# Patient Record
Sex: Female | Born: 1960 | Race: White | Hispanic: No | Smoking: Never smoker
Health system: Southern US, Community
[De-identification: ages and names within clinical notes are randomized; demographics above are authoritative.]

## PROBLEM LIST (undated history)

## (undated) DIAGNOSIS — F32A Depression, unspecified: Secondary | ICD-10-CM

## (undated) DIAGNOSIS — K589 Irritable bowel syndrome without diarrhea: Secondary | ICD-10-CM

## (undated) DIAGNOSIS — F329 Major depressive disorder, single episode, unspecified: Secondary | ICD-10-CM

## (undated) DIAGNOSIS — F419 Anxiety disorder, unspecified: Secondary | ICD-10-CM

## (undated) HISTORY — DX: Depression, unspecified: F32.A

## (undated) HISTORY — DX: Anxiety disorder, unspecified: F41.9

## (undated) HISTORY — DX: Major depressive disorder, single episode, unspecified: F32.9

## (undated) HISTORY — DX: Irritable bowel syndrome, unspecified: K58.9

---

## 2010-01-31 ENCOUNTER — Ambulatory Visit: Payer: Self-pay | Admitting: Unknown Physician Specialty

## 2010-02-11 ENCOUNTER — Ambulatory Visit: Payer: Self-pay | Admitting: Unknown Physician Specialty

## 2010-07-14 HISTORY — PX: ABDOMINAL HYSTERECTOMY: SHX81

## 2011-07-11 ENCOUNTER — Ambulatory Visit: Payer: Self-pay | Admitting: Obstetrics and Gynecology

## 2011-07-15 HISTORY — PX: LAPAROSCOPIC SUPRACERVICAL HYSTERECTOMY: SUR797

## 2011-07-17 ENCOUNTER — Ambulatory Visit: Payer: Self-pay | Admitting: Obstetrics and Gynecology

## 2011-07-18 LAB — HEMOGLOBIN: HGB: 11.7 g/dL — ABNORMAL LOW (ref 12.0–16.0)

## 2011-07-18 LAB — HEMATOCRIT: HCT: 34.2 % — ABNORMAL LOW (ref 35.0–47.0)

## 2011-07-21 LAB — PATHOLOGY REPORT

## 2013-12-12 ENCOUNTER — Ambulatory Visit: Payer: Self-pay | Admitting: Internal Medicine

## 2014-11-05 NOTE — Op Note (Signed)
PATIENT NAME:  Hailey Guerrero, WADDELL MR#:  [id number] DATE OF BIRTH:  1960/11/28  DATE OF PROCEDURE:  07/17/2011  PREOPERATIVE DIAGNOSIS: Leiomyomata of the uterus with menorrhagia.   POSTOPERATIVE DIAGNOSIS: Leiomyomata of the uterus with menorrhagia.     PROCEDURE:  Laparoscopic supracervical hysterectomy and cystoscopy.   PRIMARY SURGEON: Stoney Bang. Georgianne Fick, M.D.   ASSISTANTGlean Salen, MD.   ANESTHESIA:  General.   INTRAOPERATIVE FLUIDS: 1,200 mL of crystalloid.  ESTIMATED BLOOD LOSS: 100 mL.  URINE OUTPUT: 225 mL of clear urine. IMPLANT: Surgicel adhesion barrier placed over the cervical stump.  SPECIMENS REMOVED: Uterus without cervix. Specimen was morcellated.  DRAINS OR TUBES: Foley to gravity drainage.   PREOPERATIVE ANTIBIOTICS: 600 mg of clindamycin and 80 mg of gentamicin.   INTRAOPERATIVE FINDINGS: Enlarged fibroid uterus with one dominant fundal fibroid as well as a second left lateral broad ligament fibroid. Ovaries and tubes appeared grossly normal. Cystoscopy at the conclusion of the case revealed bilateral ureteral efflux.   COMPLICATIONS: None.  PATIENT CONDITION FOLLOWING THE PROCEDURE: Stable.   PROCEDURE IN DETAIL: The risks, benefits, and alternatives of the procedure were discussed with the patient prior to proceeding to the OR. The patient was taken to the OR, placed under general endotracheal anesthesia and positioned in the dorsal lithotomy position with arms tucked. She was prepped and draped in the usual sterile fashion. Following the time-out, a Foley catheter was placed to drain the patient's bladder and a Hulka tenaculum was placed on the anterior lip of the cervix using a sterile speculum for visualization. Next, attention was turned to the patient's abdomen. A small stab incision was made at the base of the umbilicus using an 11 blade scalpel. A Veress needle was then inserted into the abdomen noting two distinct clicks. Entry into the peritoneum was  confirmed with a water drop test and opening pressure of 4 mmHg. Following adequate insufflation and establishment of pneumoperitoneum, the Veress needle was removed. A Visiport 5 mm trocar was then advanced into the peritoneal cavity. Two lateral ports were then placed under direct visualization. These included one 5 mm port on the patient's right and an 11 mm port on the patient's left. Inspection of the peritoneal cavity revealed the above noted findings.  Attention was then turned to the right cornual region. The utero-ovarian ligament and falopian tube were transected using the harmonic scalpel. The round ligament was then incised with the harmonic scalpel and the anterior leaf of the broad ligament was incised down to the level of the internal cervical os. A bladder flap was then created. The uterine vessels were identified and cauterized using a bipolar cautery, but were not transected at this time. Attention was then turned to the patient's left cornual region. It was dissected in a similar manner. It became evident the patient had a moderate sized lower uterine segment/round broad ligament fibroid on the left following transection of the round ligament. This fibroid was shelled out by incising the overlying broad ligament. The uterine artery was then visualized entering close to the base of the broad ligament the lower uterine segment fibroid. It was cauterized with bipolar cautery and then transected using the harmonic scalpel. The bladder flap was noted to be sufficiently developed off of the cervix and the specimen was amputated at this point using the harmonic scalpel. Following amputation of the uterine specimen, hemostasis was achieved using the bipolar cautery. There was an area of bleeding on the right portion of the cervical stump which  appeared to be a branch of the uterine artery which took several attempts to achieve adequate hemostasis. Following achievement of adequate hemostasis and  irrigation of the pelvis, the uterine specimen was morcellated using a Gynecare morcellator. After morcellation, the pelvis was once again irrigated and inspected and noted to be hemostatic. Given the broad ligament fibroid, as well as the need for some bipolar on the left side to achieve hemostasis, it was decided to proceed with cystoscopy to verify no apparent ureteral injuries. The patient was administered indigo carmine prior to beginning cystoscopy. Upon performing cystoscopy, the bladder was noted to be normal in appearance with normal bladder mucosa. Both ureteral orifices were visualized and showed good brisk efflux of urine. Following cystoscopy, attention was once again turned to the abdomen. Insufflation which had been discontinued during cystoscopy was restarted and the pelvis was reinspected with good hemostasis noted. An endoscopic closure device was used to close the fascial incision of the 11 mm port site on the left. Following this, the remaining ports were removed under direct visualization. The 11 mm port site did show remaining fascial defect which was closed using a 2-0 Vicryl on a GU needle. The skin on all trocar incisions was then closed using Dermabond. Sponge, needle, and instrument counts were correct x2. The patient tolerated the procedure well and was taken to the recovery room in stable condition.    ____________________________ Stoney Bang. Georgianne Fick, MD ams:ap D: 07/17/2011 22:15:25 ET T: 07/18/2011 10:06:25 ET JOB#: [id number]  cc: Stoney Bang. Georgianne Fick, MD, <Dictator> Conan Bowens Madelon Lips MD ELECTRONICALLY SIGNED 07/22/2011 7:29

## 2014-11-05 NOTE — Op Note (Signed)
PATIENT NAME:  Hailey Guerrero, IZZO MR#:  [id number] DATE OF BIRTH:  01-Feb-1961  DATE OF PROCEDURE:  07/17/2011  PREOPERATIVE DIAGNOSIS: Leiomyomata of the uterus with menorrhagia.   POSTOPERATIVE DIAGNOSIS: Leiomyomata of the uterus with menorrhagia.   PROCEDURE:  Laparoscopic supracervical hysterectomy and cystoscopy.  PRIMARY SURGEON: Malachy Mood, MD.   ASSISTANTGlean Salen, MD.  ANESTHESIA:  General endotracheal.   TYPES OF FLUIDS:  1,200 milliliters of crystalloid.  ESTIMATED BLOOD LOSS: 100 mL.  URINE OUTPUT: 225 mL.  PREOPERATIVE ANTIBIOTICS: 600 mg of clindamycin and 80 mg of gentamicin.   <<MISSING TEXT>> Static dictation.    ____________________________ Stoney Bang. Georgianne Fick, MD ams:ap D: 07/17/2011 22:03:30 ET T: 07/18/2011 10:02:33 ET JOB#: [id number]  cc: Stoney Bang. Georgianne Fick, MD, <Dictator>

## 2015-03-28 ENCOUNTER — Encounter: Payer: Self-pay | Admitting: *Deleted

## 2015-04-09 ENCOUNTER — Ambulatory Visit: Payer: Self-pay | Admitting: General Surgery

## 2015-04-11 ENCOUNTER — Encounter: Payer: Self-pay | Admitting: General Surgery

## 2015-04-11 ENCOUNTER — Ambulatory Visit (INDEPENDENT_AMBULATORY_CARE_PROVIDER_SITE_OTHER): Payer: BC Managed Care – PPO | Admitting: General Surgery

## 2015-04-11 VITALS — BP 130/74 | HR 87 | Resp 12 | Ht 65.0 in | Wt 151.0 lb

## 2015-04-11 DIAGNOSIS — N644 Mastodynia: Secondary | ICD-10-CM | POA: Diagnosis not present

## 2015-04-11 NOTE — Progress Notes (Signed)
Patient ID: Hailey Guerrero, female   DOB: 06-Jun-1961, 54 y.o.   MRN: [id number]  Chief Complaint  Patient presents with  . Other    right breast pain    HPI Hailey Guerrero is a 54 y.o. female here today for a evaluation of right breast pain. Mammogram is scheduled for 04/17/15 at [facility]. She states she was having right breast pain for about 2 weeks, and it is now resolved.  The patient is not aware of any trauma to the area. She reports working in her yard extensively, but this is nothing new. She does not recall any triggers to the short lived, stabbing sensation she would appreciate in the upper-outer quadrant of the right breast. Patient does not do self breast checks. Last mammogram about 7 years ago.                                                                                                         HPI  No past medical history on file.  Past Surgical History  Procedure Laterality Date  . Abdominal hysterectomy  2012    No family history on file.  Social History Social History  Substance Use Topics  . Smoking status: Never Smoker   . Smokeless tobacco: None  . Alcohol Use: 0.0 oz/week    0 Standard drinks or equivalent per week    Allergies  Allergen Reactions  . Penicillins Other (See Comments)    Passes out     Current Outpatient Prescriptions  Medication Sig Dispense Refill  . cyclobenzaprine (FLEXERIL) 10 MG tablet Take 10 mg by mouth 3 (three) times daily.     Marland Kitchen HYDROcodone-acetaminophen (NORCO/VICODIN) 5-325 MG tablet 2 tablets every 6 (six) hours as needed.     . predniSONE (STERAPRED UNI-PAK 21 TAB) 10 MG (21) TBPK tablet 10 mg daily.      No current facility-administered medications for this visit.    Review of Systems Review of Systems  Constitutional: Negative.   Respiratory: Negative.   Cardiovascular: Negative.     Blood pressure 130/74, pulse 87, resp. rate 12, height 5\' 5"  (0.086 m), weight 151 lb (68.493 kg).  Physical Exam Physical Exam   Constitutional: She is oriented to person, place, and time. She appears well-developed and well-nourished.  Eyes: Conjunctivae are normal. No scleral icterus.  Neck: Neck supple.  Cardiovascular: Normal rate, regular rhythm and normal heart sounds.   Pulmonary/Chest: Effort normal and breath sounds normal. Right breast exhibits no inverted nipple, no mass, no nipple discharge, no skin change and no tenderness. Left breast exhibits no inverted nipple, no mass, no nipple discharge, no skin change and no tenderness.  Lymphadenopathy:    She has no cervical adenopathy.    She has no axillary adenopathy.  Neurological: She is alert and oriented to person, place, and time.  Skin: Skin is warm and dry.    Data Reviewed GYN notes from Bunnie Pion, M.D. dated 03/28/2015.  Assessment    Benign breast exam.    Plan    The patient is scheduled  for screening mammograms with Dr. Ammie Dalton on 04/17/2015. Unless an abnormality is identified, no further workup is required.   Patient to return as needed.  Ref. Dr. Percell Boston, [place] Gleason 04/11/2015, 3:40 PM

## 2015-04-11 NOTE — Patient Instructions (Signed)
Patient to return as needed. Continue self breast exams. Call office for any new breast issues or concerns.'  

## 2016-04-01 ENCOUNTER — Ambulatory Visit: Payer: Self-pay | Admitting: Urology

## 2016-04-07 ENCOUNTER — Encounter: Payer: Self-pay | Admitting: Urology

## 2016-04-07 ENCOUNTER — Ambulatory Visit (INDEPENDENT_AMBULATORY_CARE_PROVIDER_SITE_OTHER): Payer: BC Managed Care – PPO | Admitting: Urology

## 2016-04-07 VITALS — BP 149/86 | HR 71 | Ht 65.0 in | Wt 136.1 lb

## 2016-04-07 DIAGNOSIS — R3129 Other microscopic hematuria: Secondary | ICD-10-CM | POA: Diagnosis not present

## 2016-04-07 DIAGNOSIS — R350 Frequency of micturition: Secondary | ICD-10-CM

## 2016-04-07 LAB — URINALYSIS, COMPLETE
Bilirubin, UA: NEGATIVE
GLUCOSE, UA: NEGATIVE
Ketones, UA: NEGATIVE
Nitrite, UA: NEGATIVE
PH UA: 5.5 (ref 5.0–7.5)
PROTEIN UA: NEGATIVE
Specific Gravity, UA: 1.025 (ref 1.005–1.030)
Urobilinogen, Ur: 0.2 mg/dL (ref 0.2–1.0)

## 2016-04-07 LAB — MICROSCOPIC EXAMINATION

## 2016-04-07 NOTE — Progress Notes (Signed)
04/07/2016 3:12 PM   Hailey Guerrero 1960-08-13 [id number]  Referring provider: Chad Cordial, PA-C [address] [place], [place] [address]  Chief Complaint  Patient presents with  . New Patient (Initial Visit)    Recurrent uti    HPI: Patient is a 55 -year-old Caucasian female who presents today as a referral from their PCP, Alicia B Copland, PA-C, for microscopic hematuria.  Patient was found to have microscopic hematuria on two occassions with 4-10 RBC's/hpf.   Patient doesn't have a prior history of microscopic hematuria.    She does have a prior history of recurrent urinary tract infections (per patient report, not documented), but she denies nephrolithiasis, trauma to the genitourinary tract or malignancies of the genitourinary tract.   She does not have a family medical history of nephrolithiasis, malignancies of the genitourinary tract or hematuria.   Today, she is having symptoms of frequent urination, urgency, dysuria, nocturia, but she is not having incontinence, hesitancy, intermittency, straining to urinate or a weak urinary stream.  Her UA today demonstrates 3-10 RBC's/hpf.    She is not experiencing any suprapubic pain, abdominal pain or flank pain.  She denies any recent fevers, chills, nausea or vomiting.   She has not had any recent imaging studies.   She is not a smoker.  She is not exposed to secondhand smoke.  She does not work with chemicals.     PMH: Past Medical History:  Diagnosis Date  . Anxiety   . Depression     Surgical History: Past Surgical History:  Procedure Laterality Date  . ABDOMINAL HYSTERECTOMY  2012    Home Medications:    Medication List       Accurate as of 04/07/16  3:12 PM. Always use your most recent med list.          ibuprofen 200 MG tablet Commonly known as:  ADVIL,MOTRIN Take by mouth.   loratadine 10 MG tablet Commonly known as:  CLARITIN Take 10 mg by mouth daily.       Allergies:  Allergies    Allergen Reactions  . Penicillins Other (See Comments)    Passes out     Family History: Family History  Problem Relation Age of Onset  . Bladder Cancer Neg Hx   . Kidney cancer Neg Hx     Social History:  reports that she has never smoked. She has never used smokeless tobacco. She reports that she drinks alcohol. She reports that she does not use drugs.  ROS: UROLOGY Frequent Urination?: Yes Hard to postpone urination?: Yes Burning/pain with urination?: Yes Get up at night to urinate?: Yes Leakage of urine?: No Urine stream starts and stops?: No Trouble starting stream?: No Do you have to strain to urinate?: No Blood in urine?: Yes Urinary tract infection?: Yes Sexually transmitted disease?: No Injury to kidneys or bladder?: No Painful intercourse?: No Weak stream?: No Currently pregnant?: No Vaginal bleeding?: No Last menstrual period?: n  Gastrointestinal Nausea?: No Vomiting?: No Indigestion/heartburn?: Yes Diarrhea?: Yes Constipation?: No  Constitutional Fever: No Night sweats?: Yes Weight loss?: No Fatigue?: Yes  Skin Skin rash/lesions?: No Itching?: No  Eyes Blurred vision?: No Double vision?: No  Ears/Nose/Throat Sore throat?: Yes Sinus problems?: Yes  Hematologic/Lymphatic Swollen glands?: No Easy bruising?: No  Cardiovascular Leg swelling?: No Chest pain?: No  Respiratory Cough?: Yes Shortness of breath?: No  Endocrine Excessive thirst?: No  Musculoskeletal Back pain?: No Joint pain?: No  Neurological Headaches?: Yes Dizziness?: No  Psychologic Depression?: No Anxiety?: Yes  Physical Exam: BP (!) 149/86 (BP Location: Left Arm, Patient Position: Sitting, Cuff Size: Normal)   Pulse 71   Ht 5\' 5"  (1.651 m)   Wt 136 lb 1.6 oz (61.7 kg)   BMI 22.65 kg/m   Constitutional: Well nourished. Alert and oriented, No acute distress. HEENT: [place] AT, moist mucus membranes. Trachea midline, no masses. Cardiovascular: No  clubbing, cyanosis, or edema. Respiratory: Normal respiratory effort, no increased work of breathing. GI: Abdomen is soft, non tender, non distended, no abdominal masses. Liver and spleen not palpable.  No hernias appreciated.  Stool sample for occult testing is not indicated.   GU: No CVA tenderness.  No bladder fullness or masses.  Atrophic external genitalia, normal pubic hair distribution, no lesions.  Normal urethral meatus, no lesions, no prolapse, no discharge.   No urethral masses, tenderness and/or tenderness. No bladder fullness, tenderness or masses. Normal vagina mucosa, good estrogen effect, no discharge, no lesions, good pelvic support, no cystocele or rectocele noted.  No cervical motion tenderness.  Uterus is freely mobile and non-fixed.  No adnexal/parametria masses or tenderness noted.  Anus and perineum are without rashes or lesions.    Skin: No rashes, bruises or suspicious lesions. Lymph: No cervical or inguinal adenopathy. Neurologic: Grossly intact, no focal deficits, moving all 4 extremities. Psychiatric: Normal mood and affect.  Laboratory Data: Lab Results  Component Value Date   HGB 11.7 (L) 07/18/2011   HCT 34.2 (L) 07/18/2011    Urinalysis 3-10 RBC's and 11-30 WBC's/hpf.  See EPIC.     Assessment & Plan:    1. Microscopic hematuria  -  I explained to the patient that there are a number of causes that can be associated with blood in the urine, such as stones, UTI's, damage to the urinary tract and/or cancer.  - At this time, I felt that the patient warranted further urologic evaluation.   The AUA guidelines state that a CT urogram is the preferred imaging study to evaluate hematuria.  - I explained to the patient that a contrast material will be injected into a vein and that in rare instances, an allergic reaction can result and may even life threatening   The patient denies any allergies to contrast, iodine and/or seafood and is not taking metformin.  - Her  reproductive status is status post hysterectomy    - Following the imaging study,  I've recommended a cystoscopy. I described how this is performed, typically in an office setting with a flexible cystoscope. We described the risks, benefits, and possible side effects, the most common of which is a minor amount of blood in the urine and/or burning which usually resolves in 24 to 48 hours.    - The patient had the opportunity to ask questions which were answered. Based upon this discussion, the patient is willing to proceed. Therefore, I've ordered: a CT Urogram and cystoscopy.  - She will return following all of the above for discussion of the results.     - Urinalysis, Complete  - CULTURE, URINE COMPREHENSIVE  - BUN+Creat  2. Frequency/urgency  - CT Urogram and cystoscopy are pending   Return for CT Urogram report and cystoscopy.  These notes generated with voice recognition software. I apologize for typographical errors.  Zara Council, [facility] [address], [address] [place] [address] [place], [place] [address] [phone]

## 2016-04-08 LAB — BUN+CREAT
BUN / CREAT RATIO: 12 (ref 9–23)
BUN: 10 mg/dL (ref 6–24)
Creatinine, Ser: 0.81 mg/dL (ref 0.57–1.00)
GFR calc non Af Amer: 83 mL/min/{1.73_m2} (ref >59)
GFR, EST AFRICAN AMERICAN: 95 mL/min/{1.73_m2} (ref >59)

## 2016-04-10 ENCOUNTER — Telehealth: Payer: Self-pay

## 2016-04-10 DIAGNOSIS — N390 Urinary tract infection, site not specified: Secondary | ICD-10-CM

## 2016-04-10 LAB — CULTURE, URINE COMPREHENSIVE

## 2016-04-10 NOTE — Telephone Encounter (Signed)
LMOM

## 2016-04-10 NOTE — Telephone Encounter (Signed)
-----   Message from Nori Riis, PA-C sent at 04/10/2016  2:13 PM EDT ----- Would you call the patient and have her clarify what she means by "passing out" when she takes PCN?  She has a positive urine culture and we need to start an antibiotic.

## 2016-04-11 NOTE — Telephone Encounter (Signed)
Okay.  Let's start her on Augementin 875/125, one twice daily for seven days for her UTI.

## 2016-04-11 NOTE — Telephone Encounter (Signed)
Spoke with pt in reference to taking PCN. Pt stated that she was a teenager and had the flu when given PCN. Pt states she is not really sure if she is truly allergic to PCN.

## 2016-04-14 MED ORDER — AMOXICILLIN-POT CLAVULANATE 875-125 MG PO TABS: 1.0000 | ORAL_TABLET | Freq: Two times a day (BID) | ORAL | 0 refills | Status: AC

## 2016-04-14 NOTE — Telephone Encounter (Signed)
Spoke with pt in reference to abx. Made aware medication has been sent to pharmacy. Pt voiced understanding.

## 2016-04-16 ENCOUNTER — Ambulatory Visit
Admission: RE | Admit: 2016-04-16 | Discharge: 2016-04-16 | Disposition: A | Discharge status: Home / Self Care | Payer: BC Managed Care – PPO | Source: Ambulatory Visit | Attending: Urology | Admitting: Urology

## 2016-04-16 DIAGNOSIS — R3129 Other microscopic hematuria: Secondary | ICD-10-CM | POA: Insufficient documentation

## 2016-04-16 DIAGNOSIS — K5730 Diverticulosis of large intestine without perforation or abscess without bleeding: Secondary | ICD-10-CM | POA: Insufficient documentation

## 2016-04-16 MED ORDER — IOPAMIDOL (ISOVUE-300) INJECTION 61%: 125.0000 mL | Freq: Once | INTRAVENOUS | Status: DC | PRN

## 2016-04-18 ENCOUNTER — Emergency Department
Admission: EM | Admit: 2016-04-18 | Discharge: 2016-04-18 | Disposition: A | Discharge status: Home / Self Care | Payer: BC Managed Care – PPO | Attending: Emergency Medicine | Admitting: Emergency Medicine

## 2016-04-18 ENCOUNTER — Emergency Department: Payer: BC Managed Care – PPO

## 2016-04-18 ENCOUNTER — Other Ambulatory Visit: Payer: Self-pay | Admitting: Urology

## 2016-04-18 ENCOUNTER — Telehealth: Payer: Self-pay

## 2016-04-18 DIAGNOSIS — R1031 Right lower quadrant pain: Secondary | ICD-10-CM | POA: Insufficient documentation

## 2016-04-18 DIAGNOSIS — R1032 Left lower quadrant pain: Secondary | ICD-10-CM | POA: Diagnosis not present

## 2016-04-18 DIAGNOSIS — R1013 Epigastric pain: Secondary | ICD-10-CM | POA: Insufficient documentation

## 2016-04-18 DIAGNOSIS — Z791 Long term (current) use of non-steroidal anti-inflammatories (NSAID): Secondary | ICD-10-CM | POA: Insufficient documentation

## 2016-04-18 DIAGNOSIS — R109 Unspecified abdominal pain: Secondary | ICD-10-CM

## 2016-04-18 DIAGNOSIS — K629 Disease of anus and rectum, unspecified: Secondary | ICD-10-CM

## 2016-04-18 LAB — CBC
HCT: 43.7 % (ref 35.0–47.0)
HEMOGLOBIN: 15 g/dL (ref 12.0–16.0)
MCH: 32.2 pg (ref 26.0–34.0)
MCHC: 34.2 g/dL (ref 32.0–36.0)
MCV: 94.2 fL (ref 80.0–100.0)
Platelets: 266 10*3/uL (ref 150–440)
RBC: 4.64 MIL/uL (ref 3.80–5.20)
RDW: 12.2 % (ref 11.5–14.5)
WBC: 6.1 10*3/uL (ref 3.6–11.0)

## 2016-04-18 LAB — URINALYSIS COMPLETE WITH MICROSCOPIC (ARMC ONLY)
Bilirubin Urine: NEGATIVE
Glucose, UA: NEGATIVE mg/dL
Ketones, ur: NEGATIVE mg/dL
NITRITE: NEGATIVE
PH: 6 (ref 5.0–8.0)
PROTEIN: NEGATIVE mg/dL
SPECIFIC GRAVITY, URINE: 1.009 (ref 1.005–1.030)

## 2016-04-18 LAB — BASIC METABOLIC PANEL
ANION GAP: 7 (ref 5–15)
BUN: 10 mg/dL (ref 6–20)
CALCIUM: 9.6 mg/dL (ref 8.9–10.3)
CHLORIDE: 106 mmol/L (ref 101–111)
CO2: 27 mmol/L (ref 22–32)
Creatinine, Ser: 0.69 mg/dL (ref 0.44–1.00)
GFR calc non Af Amer: >60 mL/min (ref >60)
GLUCOSE: 97 mg/dL (ref 65–99)
Potassium: 3.3 mmol/L — ABNORMAL LOW (ref 3.5–5.1)
Sodium: 140 mmol/L (ref 135–145)

## 2016-04-18 LAB — TROPONIN I: Troponin I: <0.03 ng/mL (ref <0.03)

## 2016-04-18 LAB — LIPASE, BLOOD: Lipase: 40 U/L (ref 11–51)

## 2016-04-18 NOTE — ED Notes (Signed)
Called lab to inform them of hepatic function panel add-on.

## 2016-04-18 NOTE — Telephone Encounter (Signed)
Pt called stating since seeing Larene Beach last she has developed upper abd pain, lower back pain, and a "knot" in her stomach. Please advise.

## 2016-04-18 NOTE — ED Triage Notes (Signed)
Pt states that she has been having epigastric pain for the past 2 days that radiate outwards,pt also is having bilat flank pain and bilat lower quadrant pain. Pt is currently being treated for recurrent uti's

## 2016-04-18 NOTE — ED Provider Notes (Addendum)
[facility] Emergency Department Provider Note  ____________________________________________   I have reviewed the triage vital signs and the nursing notes.   HISTORY  Chief Complaint Chest Pain    HPI STEPANIE Guerrero is a 55 y.o. female  with a hx of "irritable stomach"  And recurrent uti. She presents today complaining of cramping discomfort throughout her entire abdomen. Patient has been worked up for this before. She also has a history of hematuria, saw urology had a CT scan done 2 days ago which was unremarkable for acute pathology. They did notice some questionable polyps and she is to follow-up for a colonoscopy. The patient states that after the CT scan she began have diffuse abdominal cramping which sometimes happens when she gets upset or nervous, she is also being treated for urinary tract infection. She denies any significant dysuria or urinary frequency or flank pain but she does have a diffuse abdominal cramping sensation which comes and goes. Currently she is not having it. She states she has no shortness of breath no vomiting. She did have some mild nausea. She has a history of heat flashes she states and she has had some of those during the course of the last 2 days but she states that those happen irrespective of whether she is having abdominal discomfort. She denies any fever or chills. She states that she has had no diarrhea. There is nothing that makes the pain better there is nothing that makes the pain worse and at this time the pain is actually gone. She described the symptoms to her primary care doctor and instructed to come to the emergency room with concern for possible atypical ACS. Patient specifically denies any chest pain or exertional symptoms. She has no pain of any variety at this time.    Past Medical History:  Diagnosis Date  . Anxiety   . Depression     Patient Active Problem List   Diagnosis Date Noted  . Mastalgia 04/11/2015     Past Surgical History:  Procedure Laterality Date  . ABDOMINAL HYSTERECTOMY  2012    Prior to Admission medications   Medication Sig Start Date End Date Taking? Authorizing Provider  amoxicillin-clavulanate (AUGMENTIN) 875-125 MG tablet Take 1 tablet by mouth 2 (two) times daily. 04/14/16 04/21/16  Larene Beach A McGowan, PA-C  ibuprofen (ADVIL,MOTRIN) 200 MG tablet Take by mouth.    Historical Provider, MD  loratadine (CLARITIN) 10 MG tablet Take 10 mg by mouth daily.    Historical Provider, MD    Allergies Penicillins  Family History  Problem Relation Age of Onset  . Bladder Cancer Neg Hx   . Kidney cancer Neg Hx     Social History Social History  Substance Use Topics  . Smoking status: Never Smoker  . Smokeless tobacco: Never Used  . Alcohol use 0.0 oz/week    Review of Systems Constitutional: No fever/chills Eyes: No visual changes. ENT: No sore throat. No stiff neck no neck pain Cardiovascular: Denies chest pain. Respiratory: Denies shortness of breath. Gastrointestinal:   no vomiting.  No diarrhea.  No constipation. Genitourinary: Negative for dysuria. Musculoskeletal: Negative lower extremity swelling Skin: Negative for rash. Neurological: Negative for severe headaches, focal weakness or numbness. 10-point ROS otherwise negative.  ____________________________________________   PHYSICAL EXAM:  VITAL SIGNS: ED Triage Vitals  Enc Vitals Group     BP 04/18/16 1353 (!) 148/99     Pulse Rate 04/18/16 1353 70     Resp 04/18/16 1353 18  Temp 04/18/16 1353 98.5 F (36.9 C)     Temp Source 04/18/16 1353 Oral     SpO2 04/18/16 1353 100 %     Weight 04/18/16 1353 135 lb (61.2 kg)     Height 04/18/16 1353 5\' 5"  (1.651 m)     Head Circumference --      Peak Flow --      Pain Score 04/18/16 1354 1     Pain Loc --      Pain Edu? --      Excl. in [place]? --     Constitutional: Alert and oriented. Well appearing and in no acute distress. Eyes: Conjunctivae are  normal. PERRL. EOMI. Head: Atraumatic. Nose: No congestion/rhinnorhea. Mouth/Throat: Mucous membranes are moist.  Oropharynx non-erythematous. Neck: No stridor.   Nontender with no meningismus Cardiovascular: Normal rate, regular rhythm. Grossly normal heart sounds.  Good peripheral circulation. Respiratory: Normal respiratory effort.  No retractions. Lungs CTAB. Abdominal: Soft and nontender. No distention. No guarding no rebound Back:  There is no focal tenderness or step off.  there is no midline tenderness there are no lesions noted. there is no CVA tenderness Musculoskeletal: No lower extremity tenderness, no upper extremity tenderness. No joint effusions, no DVT signs strong distal pulses no edema Neurologic:  Normal speech and language. No gross focal neurologic deficits are appreciated.  Skin:  Skin is warm, dry and intact. No rash noted. Psychiatric: Mood and affect are normal. Speech and behavior are normal.  ____________________________________________   LABS (all labs ordered are listed, but only abnormal results are displayed)  Labs Reviewed  BASIC METABOLIC PANEL - Abnormal; Notable for the following:       Result Value   Potassium 3.3 (*)    All other components within normal limits  URINALYSIS COMPLETEWITH MICROSCOPIC ([facility] ONLY) - Abnormal; Notable for the following:    Color, Urine YELLOW (*)    APPearance HAZY (*)    Hgb urine dipstick 2+ (*)    Leukocytes, UA 1+ (*)    Bacteria, UA RARE (*)    Squamous Epithelial / LPF 0-5 (*)    All other components within normal limits  URINE CULTURE  CBC  TROPONIN I  LIPASE, BLOOD  HEPATIC FUNCTION PANEL   ____________________________________________  EKG  I personally interpreted any EKGs ordered by me or triage Normal sinus rhythm rate 69 bpm no acute ST elevation no acute ST depression, normal axis unremarkable EKG ____________________________________________  RADIOLOGY  I reviewed any imaging ordered by me  or triage that were performed during my shift and, if possible, patient and/or family made aware of any abnormal findings. ____________________________________________   PROCEDURES  Procedure(s) performed: None  Procedures  Critical Care performed: None  ____________________________________________   INITIAL IMPRESSION / ASSESSMENT AND PLAN / ED COURSE  Pertinent labs & imaging results that were available during my care of the patient were reviewed by me and considered in my medical decision making (see chart for details).  Patient with benign workup thus far, we will obtain liver function tests and and urine although she is already being treated for UTI we will add a culture in case that is not been done as an outpatient. She has only had 1 or 2 antibiotic pills and I would expect that she still has a UTI. However, there is nothing to suggest PID, pyelonephritis or any other acute intra-abdominal pathology such as cholecystitis appendicitis or diverticulitis. Patient has no peritoneal signs actually has no discomfort or abdominal pain or  tenderness at this time. I do not believe that the sister presents referred or atypical ACS but nonetheless we did do an EKG which was negative and despite 3 days of ongoing discomfort we did a troponin which was also negative. I do not think serial cardiac enzymes will be of utility given the negative troponin and the duration of antecedent symptoms. Patient is eager to go home and requesting discharge. Her liver function tests are reassuring, as my hope that we can get her safely home. Patient has very adequate outpatient follow-up and is already under good care for multiple different providers from any of these complaints. Extensive return precautions and follow-up given and understood. Serial abdominal exams are completely benign at this time.  ----------------------------------------- 5:33 PM on  04/18/2016 -----------------------------------------  Informed by the lab liver function test could not be obtained with a blood sample that they have. I have offered to the patient that we do check and patient is adamant that she will not stay for that and does not wish another blood draw. She understands the risks benefits alternatives of not checking this in the context of abdominal pain. Patient is angry that she was sent into her angry that she has been here this long and insisted on immediately being discharged. She has no symptoms or complaints physically, we will discharge her home with extensive return precautions. Patient understands that she should come back if she feels worse in anyway. At this time there is no evidence of ACS PE dissection or intra-abdominal pathology of any significance. She is being treated for UTI culture is pending.  Clinical Course   ____________________________________________   FINAL CLINICAL IMPRESSION(S) / ED DIAGNOSES  Final diagnoses:  None      This chart was dictated using voice recognition software.  Despite best efforts to proofread,  errors can occur which can change meaning.      Schuyler Amor, MD 04/18/16 [place], MD 04/18/16 [phone]

## 2016-04-18 NOTE — ED Notes (Signed)
Lab called and informed that there is not enough blood to run the hepatic function panel and more will need to be drawn.

## 2016-04-19 LAB — URINE CULTURE: CULTURE: NO GROWTH

## 2016-04-21 ENCOUNTER — Other Ambulatory Visit: Payer: Self-pay | Admitting: Physician Assistant

## 2016-04-21 ENCOUNTER — Ambulatory Visit
Admission: RE | Admit: 2016-04-21 | Discharge: 2016-04-21 | Disposition: A | Discharge status: Home / Self Care | Payer: BC Managed Care – PPO | Source: Ambulatory Visit | Attending: Physician Assistant | Admitting: Physician Assistant

## 2016-04-21 ENCOUNTER — Telehealth: Payer: Self-pay

## 2016-04-21 DIAGNOSIS — R1013 Epigastric pain: Secondary | ICD-10-CM | POA: Insufficient documentation

## 2016-04-21 DIAGNOSIS — R1011 Right upper quadrant pain: Secondary | ICD-10-CM

## 2016-04-21 DIAGNOSIS — R932 Abnormal findings on diagnostic imaging of liver and biliary tract: Secondary | ICD-10-CM | POA: Insufficient documentation

## 2016-04-21 NOTE — Telephone Encounter (Signed)
Pt called stating she has a constant throb in her right side and flank. Per Larene Beach pt CT is negative and pt should return to the ER due to possible liver issues. Made pt aware. Pt became very angry and stated "I went to the ER Friday and they made it out like everything was fine." Reinforced with pt she denied to have liver function labs drawn therefore she should go and have those labs drawn. Pt stated "of course I was not waiting around for that. I had already been there for hours." At which point pt hung up.

## 2016-04-25 ENCOUNTER — Other Ambulatory Visit: Payer: BC Managed Care – PPO

## 2016-04-29 ENCOUNTER — Other Ambulatory Visit: Payer: BC Managed Care – PPO

## 2016-05-12 ENCOUNTER — Ambulatory Visit (INDEPENDENT_AMBULATORY_CARE_PROVIDER_SITE_OTHER): Payer: BC Managed Care – PPO | Admitting: Urology

## 2016-05-12 VITALS — BP 160/98 | HR 71 | Ht 65.0 in | Wt 135.0 lb

## 2016-05-12 DIAGNOSIS — R3129 Other microscopic hematuria: Secondary | ICD-10-CM

## 2016-05-12 LAB — MICROSCOPIC EXAMINATION

## 2016-05-12 LAB — URINALYSIS, COMPLETE
Bilirubin, UA: NEGATIVE
Glucose, UA: NEGATIVE
KETONES UA: NEGATIVE
NITRITE UA: NEGATIVE
PH UA: 5.5 (ref 5.0–7.5)
Protein, UA: NEGATIVE
SPEC GRAV UA: 1.025 (ref 1.005–1.030)
Urobilinogen, Ur: 0.2 mg/dL (ref 0.2–1.0)

## 2016-05-12 MED ORDER — CIPROFLOXACIN HCL 500 MG PO TABS
500.0000 mg | ORAL_TABLET | Freq: Once | ORAL | Status: AC
  Administered 2016-05-12: 500 mg via ORAL

## 2016-05-12 MED ORDER — LIDOCAINE HCL 2 % EX GEL
1.0000 "application " | Freq: Once | CUTANEOUS | Status: AC
  Administered 2016-05-12: 1 via URETHRAL

## 2016-05-12 NOTE — Progress Notes (Signed)
   05/12/16  CC:  Chief Complaint  Patient presents with  . Cysto    HPI:  Blood pressure (!) 160/98, pulse 71, height 5\' 5"  (1.651 m), weight 135 lb (61.2 kg). NED. A&Ox3.   No respiratory distress   Abd soft, NT, ND Normal external genitalia with patent urethral meatus  Cystoscopy Procedure Note  Patient identification was confirmed, informed consent was obtained, and patient was prepped using Betadine solution.  Lidocaine jelly was administered per urethral meatus.    Preoperative abx where received prior to procedure.    Procedure: - Flexible cystoscope introduced, without any difficulty.   - Thorough search of the bladder revealed:    normal urethral meatus    normal urothelium    no stones    no ulcers     no tumors    no urethral polyps    no trabeculation  - Ureteral orifices were normal in position and appearance.  Post-Procedure: - Patient tolerated the procedure well  Assessment/ Plan:  Patient has had a complete hematuria evaluation which was largely unremarkable. 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 She does have a tight vaginal introitus and firm posterior vaginal wall. She may benefit from pelvic for physical therapy if the remaining aspect of her workup is nondiagnostic as she may have pelvic floor dysfunction. The patient will follow up with [place] on an as-needed basis.   Ardis Hughs, MD

## 2016-05-21 ENCOUNTER — Other Ambulatory Visit: Payer: Self-pay

## 2016-05-21 ENCOUNTER — Encounter: Payer: Self-pay | Admitting: Gastroenterology

## 2016-05-21 ENCOUNTER — Ambulatory Visit (INDEPENDENT_AMBULATORY_CARE_PROVIDER_SITE_OTHER): Payer: BC Managed Care – PPO | Admitting: Gastroenterology

## 2016-05-21 VITALS — BP 141/71 | HR 77 | Temp 98.4°F | Ht 65.0 in | Wt 137.0 lb

## 2016-05-21 DIAGNOSIS — R1013 Epigastric pain: Secondary | ICD-10-CM | POA: Insufficient documentation

## 2016-05-21 DIAGNOSIS — R198 Other specified symptoms and signs involving the digestive system and abdomen: Secondary | ICD-10-CM | POA: Insufficient documentation

## 2016-05-21 DIAGNOSIS — R140 Abdominal distension (gaseous): Secondary | ICD-10-CM | POA: Diagnosis not present

## 2016-05-21 DIAGNOSIS — R933 Abnormal findings on diagnostic imaging of other parts of digestive tract: Secondary | ICD-10-CM

## 2016-05-21 MED ORDER — RIFAXIMIN 550 MG PO TABS: 550.0000 mg | ORAL_TABLET | Freq: Three times a day (TID) | ORAL | 0 refills | Status: AC

## 2016-05-21 MED ORDER — PANTOPRAZOLE SODIUM 40 MG PO TBEC: 40.0000 mg | DELAYED_RELEASE_TABLET | Freq: Every day | ORAL | 0 refills | Status: DC

## 2016-05-21 NOTE — Progress Notes (Signed)
Gastroenterology Consultation  Referring Provider:     Tracie Harrier, MD Primary Care Physician:  Hailey Harrier, MD Primary Gastroenterologist:  Dr. Jonathon Guerrero  Reason for Consultation:     Abdominal pain         HPI:   Hailey Guerrero is a 55 y.o. y/o female referred for consultation & management  by Dr. Tracie Harrier, MD.     Abdominal pain: Onset:  Ongoing since 05/06/16- has had multiple UTIs. Since the past 2 weeks, not as severe . Comes and goes presently, previously was continuous. Occurs every 15 minutes, each episode lasts for a few seconds.  Site :epigastrium, middle of the abdomen , Radiation: was previously going to her back now not.  Nature of pain: feels like a rope tied around her  Aggravating factors:  Worse when she sits  Relieving factors :nothing she can be sure off  Weight loss: yes not much  NSAID use: none  PPI use :tried protonix for 14 days but did not work .took it at 2 pm daily .  Gall bladder surgery: intact  Frequency of bowel movements: usually first thing in the morning , going 1-2 times last few days, sloppy in nature  Change in bowel movements: changed last few months  Relief with bowel movements: unsure  Gas/Bloating/Abdominal distension: lots , lots of gurgling sounds, gas and bloating correlates with her abdominal pain.  When she passes gas not foul smelling . She chews gum - 1 piece everyday . No other artificial sugars.   Never had a colonoscopy. No family history of colon cancer or polyps.     [facility] RUQ - no gall stones CT hematuria work up : nodularity along wall of proximal rectum  Diverticulosis of descending colon   Stool studies 05/07/16-negative for infection . Hb 13.6. BMP,LFT-normal    Past Medical History:  Diagnosis Date  . Anxiety   . Depression     Past Surgical History:  Procedure Laterality Date  . ABDOMINAL HYSTERECTOMY  2012    Prior to Admission medications   Medication Sig Start Date End Date Taking?  Authorizing Provider  pantoprazole (PROTONIX) 40 MG tablet Take by mouth. 05/13/16  Yes Historical Provider, MD  dicyclomine (BENTYL) 10 MG capsule Take by mouth. 05/06/16 05/06/17  Historical Provider, MD    Family History  Problem Relation Age of Onset  . Bladder Cancer Neg Hx   . Kidney cancer Neg Hx      Social History  Substance Use Topics  . Smoking status: Never Smoker  . Smokeless tobacco: Never Used  . Alcohol use 0.0 oz/week    Allergies as of 05/21/2016 - Review Complete 05/21/2016  Allergen Reaction Noted  . Penicillins Other (See Comments) 04/11/2015    Review of Systems:    All systems reviewed and negative except where noted in HPI.   Physical Exam:  BP (!) 141/71   Pulse 77   Temp 98.4 F (36.9 C) (Oral)   Ht 5\' 5"  (1.651 m)   Wt 137 lb (62.1 kg)   BMI 22.80 kg/m  No LMP recorded. Patient has had a hysterectomy. Psych:  Alert and cooperative. Normal mood and affect. General:   Alert,  Well-developed, well-nourished, pleasant and cooperative in NAD Head:  Normocephalic and atraumatic. Eyes:  Sclera clear, no icterus.   Conjunctiva pink. Ears:  Normal auditory acuity. Nose:  No deformity, discharge, or lesions. Mouth:  No deformity or lesions,oropharynx pink & moist. Neck:  Supple; no masses or thyromegaly. Lungs:  Respirations even and unlabored.  Clear throughout to auscultation.   No wheezes, crackles, or rhonchi. No acute distress. Heart:  Regular rate and rhythm; no murmurs, clicks, rubs, or gallops. Abdomen:  Normal bowel sounds.  No bruits.  Soft, non-tender and non-distended without masses, hepatosplenomegaly or hernias noted.  No guarding or rebound tenderness.    Msk:  Symmetrical without gross deformities. Good, equal movement & strength bilaterally. Pulses:  Normal pulses noted. Extremities:  No clubbing or edema.  No cyanosis. Neurologic:  Alert and oriented x3;  grossly normal neurologically. Skin:  Intact without significant lesions or  rashes. No jaundice. Lymph Nodes:  No significant cervical adenopathy. Psych:  Alert and cooperative. Normal mood and affect.  Imaging Studies: No results found.  Assessment and Plan:   Hailey Guerrero is a 55 y.o. y/o female has been referred for abdominal pain . Appears the abdominal pain more acute in nature and seems to be slightly better. She does have an abnormality on her CT scan which shows nodularity in her rectum . She also has a lot of gas and bloating and may have small bowel bacterial overgrowth .   Plan: 1. EGD and colonoscopy to evaluate abdominal pain and abnormal CT scan  2. Stool for H pylori antigen  3. Advised to take protonix first thing in the morning on an empty stomach and eat 30 mins after' 4. Trial of Xifaxan to be commenced after bidirectional endoscopy .  5. Check for celiac disease  Follow up in 4 weeks  Dr Hailey Bellows MD

## 2016-05-22 ENCOUNTER — Other Ambulatory Visit: Payer: Self-pay

## 2016-05-22 DIAGNOSIS — Z1211 Encounter for screening for malignant neoplasm of colon: Secondary | ICD-10-CM

## 2016-05-22 MED ORDER — NA SULFATE-K SULFATE-MG SULF 17.5-3.13-1.6 GM/177ML PO SOLN: 1.0000 | Freq: Once | ORAL | 0 refills | Status: AC

## 2016-05-24 LAB — CELIAC DISEASE PANEL
ENDOMYSIAL IGA: NEGATIVE
IGA/IMMUNOGLOBULIN A, SERUM: 304 mg/dL (ref 87–352)
Transglutaminase IgA: <2 U/mL (ref 0–3)

## 2016-05-24 LAB — C-REACTIVE PROTEIN

## 2016-05-25 LAB — H. PYLORI ANTIGEN, STOOL: H PYLORI AG STL: NEGATIVE

## 2016-05-30 ENCOUNTER — Ambulatory Visit: Payer: BC Managed Care – PPO | Admitting: Anesthesiology

## 2016-05-30 ENCOUNTER — Encounter
Admission: RE | Disposition: A | Discharge status: Home / Self Care | Payer: Self-pay | Source: Ambulatory Visit | Attending: Gastroenterology

## 2016-05-30 ENCOUNTER — Ambulatory Visit
Admission: RE | Admit: 2016-05-30 | Discharge: 2016-05-30 | Disposition: A | Discharge status: Home / Self Care | Payer: BC Managed Care – PPO | Source: Ambulatory Visit | Attending: Gastroenterology | Admitting: Gastroenterology

## 2016-05-30 DIAGNOSIS — D490 Neoplasm of unspecified behavior of digestive system: Secondary | ICD-10-CM | POA: Diagnosis not present

## 2016-05-30 DIAGNOSIS — K2950 Unspecified chronic gastritis without bleeding: Secondary | ICD-10-CM | POA: Insufficient documentation

## 2016-05-30 DIAGNOSIS — Z880 Allergy status to penicillin: Secondary | ICD-10-CM | POA: Diagnosis not present

## 2016-05-30 DIAGNOSIS — K219 Gastro-esophageal reflux disease without esophagitis: Secondary | ICD-10-CM | POA: Diagnosis not present

## 2016-05-30 DIAGNOSIS — K5730 Diverticulosis of large intestine without perforation or abscess without bleeding: Secondary | ICD-10-CM | POA: Insufficient documentation

## 2016-05-30 DIAGNOSIS — R933 Abnormal findings on diagnostic imaging of other parts of digestive tract: Secondary | ICD-10-CM | POA: Diagnosis not present

## 2016-05-30 DIAGNOSIS — D375 Neoplasm of uncertain behavior of rectum: Secondary | ICD-10-CM | POA: Insufficient documentation

## 2016-05-30 DIAGNOSIS — R1013 Epigastric pain: Secondary | ICD-10-CM | POA: Diagnosis present

## 2016-05-30 HISTORY — PX: COLONOSCOPY WITH PROPOFOL: SHX5780

## 2016-05-30 HISTORY — PX: ESOPHAGOGASTRODUODENOSCOPY (EGD) WITH PROPOFOL: SHX5813

## 2016-05-30 SURGERY — COLONOSCOPY WITH PROPOFOL
Anesthesia: General

## 2016-05-30 MED ORDER — FENTANYL CITRATE (PF) 100 MCG/2ML IJ SOLN
INTRAMUSCULAR | Status: DC | PRN
  Administered 2016-05-30: 50 ug via INTRAVENOUS

## 2016-05-30 MED ORDER — PROPOFOL 500 MG/50ML IV EMUL
INTRAVENOUS | Status: DC | PRN
  Administered 2016-05-30: 125 ug/kg/min via INTRAVENOUS

## 2016-05-30 MED ORDER — PROPOFOL 10 MG/ML IV BOLUS
INTRAVENOUS | Status: DC | PRN
  Administered 2016-05-30: 60 mg via INTRAVENOUS

## 2016-05-30 MED ORDER — MIDAZOLAM HCL 2 MG/2ML IJ SOLN
INTRAMUSCULAR | Status: DC | PRN
  Administered 2016-05-30: 1 mg via INTRAVENOUS

## 2016-05-30 MED ORDER — SODIUM CHLORIDE 0.9 % IV SOLN
INTRAVENOUS | Status: DC
  Administered 2016-05-30 (×2): via INTRAVENOUS

## 2016-05-30 MED ORDER — PHENYLEPHRINE HCL 10 MG/ML IJ SOLN
INTRAMUSCULAR | Status: DC | PRN
  Administered 2016-05-30 (×5): 100 ug via INTRAVENOUS

## 2016-05-30 NOTE — Anesthesia Preprocedure Evaluation (Signed)
Anesthesia Evaluation  Patient identified by MRN, date of birth, ID band Patient awake    Reviewed: Allergy & Precautions, NPO status , Patient's Chart, lab work & pertinent test results  History of Anesthesia Complications Negative for: history of anesthetic complications  Airway Mallampati: II       Dental   Pulmonary neg pulmonary ROS,           Cardiovascular negative cardio ROS       Neuro/Psych negative neurological ROS     GI/Hepatic Neg liver ROS, GERD  Medicated,  Endo/Other  negative endocrine ROS  Renal/GU negative Renal ROS     Musculoskeletal   Abdominal   Peds  Hematology negative hematology ROS (+)   Anesthesia Other Findings   Reproductive/Obstetrics                             Anesthesia Physical Anesthesia Plan  ASA: II  Anesthesia Plan: General   Post-op Pain Management:    Induction: Intravenous  Airway Management Planned: Nasal Cannula  Additional Equipment:   Intra-op Plan:   Post-operative Plan:   Informed Consent: I have reviewed the patients History and Physical, chart, labs and discussed the procedure including the risks, benefits and alternatives for the proposed anesthesia with the patient or authorized representative who has indicated his/her understanding and acceptance.     Plan Discussed with:   Anesthesia Plan Comments:         Anesthesia Quick Evaluation

## 2016-05-30 NOTE — Op Note (Signed)
[facility] Gastroenterology Patient Name: Hailey Guerrero Procedure Date: 05/30/2016 11:13 AM MRN: [id number] Account #: [id number] Date of Birth: [birth year] Admit Type: Outpatient Age: 55 Room: [facility] ENDO ROOM 4 Gender: Female Note Status: Finalized Procedure:            Colonoscopy Indications:          Abnormal CT of the GI tract Providers:            Jonathon Bellows MD, MD Referring MD:         Tracie Harrier, MD (Referring MD) Medicines:            Monitored Anesthesia Care Complications:        No immediate complications. Procedure:            Pre-Anesthesia Assessment:                       - Prior to the procedure, a History and Physical was                        performed, and patient medications, allergies and                        sensitivities were reviewed. The patient's tolerance of                        previous anesthesia was reviewed.                       - The risks and benefits of the procedure and the                        sedation options and risks were discussed with the                        patient. All questions were answered and informed                        consent was obtained.                       - ASA Grade Assessment: II - A patient with mild                        systemic disease.                       After obtaining informed consent, the colonoscope was                        passed under direct vision. Throughout the procedure,                        the patient's blood pressure, pulse, and oxygen                        saturations were monitored continuously. The                        Colonoscope was introduced through the anus and  advanced to the the cecum, identified by the                        appendiceal orifice, IC valve and transillumination.                        The colonoscopy was performed with ease. The patient                        tolerated the procedure well. The quality of the  bowel                        preparation was excellent. Findings:      A few small-mouthed diverticula were found in the sigmoid colon.      An ulcerated non-obstructing medium-sized mass was found in the rectum.       The mass was partially circumferential (involving one-third of the lumen       circumference). The mass measured two cm in length. In addition, its       diameter measured three mm. [Bleeding]on contact. This was biopsied with       a cold forceps for histology. Impression:           - Diverticulosis in the sigmoid colon.                       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                       - The examination was suspicious for a                        malignant-appearing tumor. Removal was not done.                        Biopsied. Recommendation:       - Discharge patient to home (with escort).                       - Patient has a contact number available for                        emergencies. The signs and symptoms of potential                        delayed complications were discussed with the patient.                        Return to normal activities tomorrow. Written discharge                        instructions were provided to the patient.                       - Resume previous diet.                       - Await pathology results.                       - Return to my office in 1 week. Procedure Code(s):    --- Professional ---  45380, Colonoscopy, flexible; with biopsy, single or                        multiple Diagnosis Code(s):    --- Professional ---                       D49.0, Neoplasm of unspecified behavior of digestive                        system                       K57.30, Diverticulosis of large intestine without                        perforation or abscess without bleeding                       R93.3, Abnormal findings on diagnostic imaging of other                        parts of digestive  tract CPT copyright 2016 [employer]. All rights reserved. The codes documented in this report are preliminary and upon coder review may  be revised to meet current compliance requirements. Jonathon Bellows, MD Jonathon Bellows MD, MD 05/30/2016 11:46:34 AM This report has been signed electronically. Number of Addenda: 0 Note Initiated On: 05/30/2016 11:13 AM Scope Withdrawal Time: 0 hours 12 minutes 40 seconds  Total Procedure Duration: 0 hours 17 minutes 40 seconds       [facility]

## 2016-05-30 NOTE — Op Note (Signed)
[facility] Gastroenterology Patient Name: Hailey Guerrero Procedure Date: 05/30/2016 11:10 AM MRN: [id number] Account #: [id number] Date of Birth: May 27, 1961 Admit Type: Ambulatory Age: 55 Room: [facility] ENDO ROOM 4 Gender: Female Note Status: Finalized Procedure:            Upper GI endoscopy Indications:          Dyspepsia Providers:            Jonathon Bellows MD, MD Referring MD:         Tracie Harrier, MD (Referring MD) Medicines:            Monitored Anesthesia Care Complications:        No immediate complications. Procedure:            Pre-Anesthesia Assessment:                       - ASA Grade Assessment: II - A patient with mild                        systemic disease.                       - Prior to the procedure, a History and Physical was                        performed, and patient medications, allergies and                        sensitivities were reviewed. The patient's tolerance of                        previous anesthesia was reviewed.                       - The risks and benefits of the procedure and the                        sedation options and risks were discussed with the                        patient. All questions were answered and informed                        consent was obtained.                       After obtaining informed consent, the endoscope was                        passed under direct vision. Throughout the procedure,                        the patient's blood pressure, pulse, and oxygen                        saturations were monitored continuously. The Endoscope                        was introduced through the mouth, and advanced to the  third part of duodenum. The upper GI endoscopy was                        accomplished with ease. The patient tolerated the                        procedure well. Findings:      The esophagus was normal.      The examined duodenum was normal.      The entire  examined stomach was normal. Biopsies were taken with a cold       forceps for histology. Impression:           - Normal esophagus.                       - Normal examined duodenum.                       - Normal stomach. Biopsied. Recommendation:       - Patient has a contact number available for                        emergencies. The signs and symptoms of potential                        delayed complications were discussed with the patient.                        Return to normal activities tomorrow. Written discharge                        instructions were provided to the patient.                       - Resume previous diet.                       - Discharge patient to home (with escort). Procedure Code(s):    --- Professional ---                       [id number], Esophagogastroduodenoscopy, flexible, transoral;                        with biopsy, single or multiple Diagnosis Code(s):    --- Professional ---                       R10.13, Epigastric pain CPT copyright 2016 American Medical Association. All rights reserved. The codes documented in this report are preliminary and upon coder review may  be revised to meet current compliance requirements. Jonathon Bellows, MD Jonathon Bellows MD, MD 05/30/2016 11:23:35 AM This report has been signed electronically. Number of Addenda: 0 Note Initiated On: 05/30/2016 11:10 AM      [facility]

## 2016-05-30 NOTE — Anesthesia Procedure Notes (Signed)
Date/Time: 05/30/2016 11:20 AM Performed by: Nelda Marseille Pre-anesthesia Checklist: Patient identified, Emergency Drugs available, Suction available, Patient being monitored and Timeout performed Oxygen Delivery Method: Nasal cannula

## 2016-05-30 NOTE — H&P (Signed)
  Jonathon Bellows MD [address] [place], [place] [address] Phone: [phone] Fax : [phone]  Primary Care Physician:  Tracie Harrier, MD Primary Gastroenterologist:  Dr. Jonathon Bellows   Pre-Procedure History & Physical: HPI:  Hailey Guerrero is a 55 y.o. female is here for an endoscopy and colonoscopy.   Past Medical History:  Diagnosis Date  . Anxiety   . Depression     Past Surgical History:  Procedure Laterality Date  . ABDOMINAL HYSTERECTOMY  2012    Prior to Admission medications   Medication Sig Start Date End Date Taking? Authorizing Provider  dicyclomine (BENTYL) 10 MG capsule Take by mouth. 05/06/16 05/06/17  Historical Provider, MD  pantoprazole (PROTONIX) 40 MG tablet Take 1 tablet (40 mg total) by mouth daily. 05/21/16 06/20/16  Jonathon Bellows, MD  rifaximin (XIFAXAN) 550 MG TABS tablet Take 1 tablet (550 mg total) by mouth 3 (three) times daily. 05/21/16 06/04/16  Jonathon Bellows, MD    Allergies as of 05/21/2016 - Review Complete 05/21/2016  Allergen Reaction Noted  . Penicillins Other (See Comments) 04/11/2015    Family History  Problem Relation Age of Onset  . Bladder Cancer Neg Hx   . Kidney cancer Neg Hx     Social History   Social History  . Marital status: Married    Spouse name: N/A  . Number of children: N/A  . Years of education: N/A   Occupational History  . Not on file.   Social History Main Topics  . Smoking status: Never Smoker  . Smokeless tobacco: Never Used  . Alcohol use 0.0 oz/week  . Drug use: No  . Sexual activity: Not on file   Other Topics Concern  . Not on file   Social History Narrative  . No narrative on file    Review of Systems: See HPI, otherwise negative ROS  Physical Exam: BP 134/88   Pulse 78   Temp 99.2 F (37.3 C) (Tympanic)   Resp 19   Ht 5\' 5"  (1.651 m)   Wt 137 lb (62.1 kg)   SpO2 100%   BMI 22.80 kg/m  General:   Alert,  pleasant and cooperative in NAD Head:  Normocephalic and  atraumatic. Neck:  Supple; no masses or thyromegaly. Lungs:  Clear throughout to auscultation.    Heart:  Regular rate and rhythm. Abdomen:  Soft, nontender and nondistended. Normal bowel sounds, without guarding, and without rebound.   Neurologic:  Alert and  oriented x4;  grossly normal neurologically.  Impression/Plan: Hailey Guerrero is here for an endoscopy and colonoscopy to be performed for abdominal pain, bloating and thickening/nodularity of rectum seen on CT scan   Risks, benefits, limitations, and alternatives regarding  endoscopy and colonoscopy have been reviewed with the patient.  Questions have been answered.  All parties agreeable.   Jonathon Bellows, MD  05/30/2016, 10:45 AM

## 2016-05-30 NOTE — Transfer of Care (Signed)
Immediate Anesthesia Transfer of Care Note  Patient: Hailey Guerrero  Procedure(s) Performed: Procedure(s): COLONOSCOPY WITH PROPOFOL (N/A) ESOPHAGOGASTRODUODENOSCOPY (EGD) WITH PROPOFOL (N/A)  Patient Location: PACU  Anesthesia Type:General  Level of Consciousness: sedated  Airway & Oxygen Therapy: Patient Spontanous Breathing and Patient connected to nasal cannula oxygen  Post-op Assessment: Report given to RN and Post -op Vital signs reviewed and stable  Post vital signs: Reviewed and stable  Last Vitals:  Vitals:   05/30/16 1043  BP: 134/88  Pulse: 78  Resp: 19  Temp: 37.3 C    Last Pain:  Vitals:   05/30/16 1043  TempSrc: Tympanic         Complications: No apparent anesthesia complications

## 2016-05-30 NOTE — Anesthesia Postprocedure Evaluation (Signed)
Anesthesia Post Note  Patient: Hailey Guerrero  Procedure(s) Performed: Procedure(s) (LRB): COLONOSCOPY WITH PROPOFOL (N/A) ESOPHAGOGASTRODUODENOSCOPY (EGD) WITH PROPOFOL (N/A)  Patient location during evaluation: Endoscopy Anesthesia Type: General Level of consciousness: awake and alert Pain management: pain level controlled Vital Signs Assessment: post-procedure vital signs reviewed and stable Respiratory status: spontaneous breathing and respiratory function stable Cardiovascular status: stable Anesthetic complications: no    Last Vitals:  Vitals:   05/30/16 1149 05/30/16 1150  BP:  97/66  Pulse:  66  Resp:  17  Temp: 36.4 C     Last Pain:  Vitals:   05/30/16 1150  TempSrc:   PainSc: Asleep                 KEPHART,WILLIAM K

## 2016-06-02 ENCOUNTER — Encounter: Payer: Self-pay | Admitting: Gastroenterology

## 2016-06-02 ENCOUNTER — Telehealth: Payer: Self-pay | Admitting: Gastroenterology

## 2016-06-02 LAB — SURGICAL PATHOLOGY

## 2016-06-02 NOTE — Telephone Encounter (Signed)
Patient called and wanted to know if her results are in yet? Please call

## 2016-06-02 NOTE — Telephone Encounter (Signed)
Pt notified results are not back as this procedure was just done on Friday. Will contact pt as soon as results are available.

## 2016-06-03 ENCOUNTER — Telehealth: Payer: Self-pay

## 2016-06-03 NOTE — Telephone Encounter (Signed)
-----   Message from Jonathon Bellows, MD sent at 05/30/2016 11:47 AM EST ----- Regarding: follow up appt in 1 week or 10 days Hi   Did her colonoscopy , am very concerned she has a rectal cancer, bx taken, would like follow up to see me to discuss results as soon as the results are back likely in 7-10 days.  Hailey Guerrero

## 2016-06-03 NOTE — Telephone Encounter (Signed)
Left vm requesting pt to schedule a follow up appt tomorrow morning at 9:30am in [place].

## 2016-06-04 ENCOUNTER — Encounter: Payer: Self-pay | Admitting: Gastroenterology

## 2016-06-04 ENCOUNTER — Ambulatory Visit (INDEPENDENT_AMBULATORY_CARE_PROVIDER_SITE_OTHER): Payer: BC Managed Care – PPO | Admitting: Gastroenterology

## 2016-06-04 VITALS — BP 166/86 | HR 70 | Temp 97.9°F | Ht 65.0 in | Wt 135.0 lb

## 2016-06-04 DIAGNOSIS — K629 Disease of anus and rectum, unspecified: Secondary | ICD-10-CM

## 2016-06-04 DIAGNOSIS — K6289 Other specified diseases of anus and rectum: Secondary | ICD-10-CM

## 2016-06-04 NOTE — Progress Notes (Signed)
Primary Care Physician: Tracie Harrier, MD  Primary Gastroenterologist:  Dr. Jonathon Bellows   Chief Complaint  Patient presents with  . Follow-up    colonoscopy results     HPI: Hailey Guerrero is a 55 y.o. female here to follow up for her recent colonoscopy. . She/He was last seen on 05/30/2016 when she underwent a colonoscopy by myself. She was initially seen on 05/21/16 for abdominal pain . She has an abnormal appearing rectum seen incidentally on a CT scan for renal stones. H pylori stool antigen was negative. EGD was normal and biopsies showed mild chronic gastritis with no H pylori. We proceeded with a colonoscopy. An ulcerated non-obstructing medium-sized mass was found in the rectum. The mass was partially circumferential (involving one-third of the lumen circumference). 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 It wasnot felt on a rectal exam .    Current Outpatient Prescriptions  Medication Sig Dispense Refill  . dicyclomine (BENTYL) 10 MG capsule Take by mouth.    . pantoprazole (PROTONIX) 40 MG tablet Take 1 tablet (40 mg total) by mouth daily. 30 tablet 0  . rifaximin (XIFAXAN) 550 MG TABS tablet Take 1 tablet (550 mg total) by mouth 3 (three) times daily. 42 tablet 0   No current facility-administered medications for this visit.     Allergies as of 06/04/2016 - Review Complete 06/04/2016  Allergen Reaction Noted  . Penicillins Other (See Comments) 04/11/2015    ROS:  General: Negative for anorexia, weight loss, fever, chills, fatigue, weakness. ENT: Negative for hoarseness, difficulty swallowing , nasal congestion. CV: Negative for chest pain, angina, palpitations, dyspnea on exertion, peripheral edema.  Respiratory: Negative for dyspnea at rest, dyspnea on exertion, cough, sputum, wheezing.  GI: See history of present illness. GU:  Negative for dysuria, hematuria, urinary  incontinence, urinary frequency, nocturnal urination.  Endo: Negative for unusual weight change.    Physical Examination:   BP (!) 166/86   Pulse 70   Temp 97.9 F (36.6 C) (Oral)   Ht 5\' 5"  (1.651 m)   Wt 135 lb (61.2 kg)   BMI 22.47 kg/m   General: Well-nourished, well-developed in no acute distress.  Eyes: No icterus. Conjunctivae pink. Mouth: Oropharyngeal mucosa moist and pink , no lesions erythema or exudate. Lungs: Clear to auscultation bilaterally. Non-labored. Heart: Regular rate and rhythm, no murmurs rubs or gallops.  Abdomen: Bowel sounds are normal, nontender, nondistended, no hepatosplenomegaly or masses, no abdominal bruits or hernia , no rebound or guarding.   Extremities: No lower extremity edema. No clubbing or deformities. Neuro: Alert and oriented x 3.  Grossly intact. Skin: Warm and dry, no jaundice.   Psych: Alert and cooperative, normal mood and affect.  Labs:   Imaging Studies: No results found.  Assessment and Plan:   Hailey Guerrero is a 55 y.o. y/o female here to discuss the results of her recent colonoscopy where a mass was seen in the rectum that was biopsied but not resected.I explained the results of the biopsy. Explained that only samples were taken from the mass , it may still very likelly have underlying cancer. Overall it still needs to come out. I will refer to the surgeons, obtain MRI pelvis. Once she has the surgery , will get the specimen tested for Lynch syndrome and then advise on appropriate surveillance for her self and her children.   Dr Jonathon Bellows  MD  Follow-up in 4 weeks

## 2016-06-16 ENCOUNTER — Ambulatory Visit
Admission: RE | Admit: 2016-06-16 | Discharge: 2016-06-16 | Disposition: A | Discharge status: Home / Self Care | Payer: BC Managed Care – PPO | Source: Ambulatory Visit | Attending: Gastroenterology | Admitting: Gastroenterology

## 2016-06-16 ENCOUNTER — Telehealth: Payer: Self-pay

## 2016-06-16 DIAGNOSIS — K629 Disease of anus and rectum, unspecified: Secondary | ICD-10-CM | POA: Diagnosis not present

## 2016-06-16 DIAGNOSIS — K6289 Other specified diseases of anus and rectum: Secondary | ICD-10-CM

## 2016-06-16 MED ORDER — GADOBENATE DIMEGLUMINE 529 MG/ML IV SOLN: 15.0000 mL | Freq: Once | INTRAVENOUS | Status: DC | PRN

## 2016-06-16 MED ORDER — GADOBENATE DIMEGLUMINE 529 MG/ML IV SOLN
15.0000 mL | Freq: Once | INTRAVENOUS | Status: AC | PRN
  Administered 2016-06-16: 12 mL via INTRAVENOUS

## 2016-06-16 NOTE — Telephone Encounter (Signed)
Pt notified of MRI results

## 2016-06-16 NOTE — Progress Notes (Signed)
Inform pt- no abnormalities seen beyond rectum on MRI, await surgery consult

## 2016-06-16 NOTE — Telephone Encounter (Signed)
-----   Message from Jonathon Bellows, MD sent at 06/16/2016  2:24 PM EST ----- Inform pt- no abnormalities seen beyond rectum on MRI, await surgery consult

## 2016-06-20 ENCOUNTER — Telehealth: Payer: Self-pay

## 2016-06-20 NOTE — Telephone Encounter (Signed)
-----   Message from Wayna Chalet, [place] sent at 06/04/2016  1:42 PM EST ----- Regarding: MRI appointment Look for MRI results. Patient will see Dr. Dahlia Byes on 06/25/2016 to go over her MRI and surgery consult.

## 2016-06-20 NOTE — Telephone Encounter (Signed)
Called patient to let her know that she will need to see Dr. Dahlia Byes on 06/25/2016. She stated that she was not able to talk at the time that I called her. All I wanted to do was to remind her of her appointment with Dr. Dahlia Byes.

## 2016-06-25 ENCOUNTER — Encounter: Payer: Self-pay | Admitting: Surgery

## 2016-06-25 ENCOUNTER — Ambulatory Visit (INDEPENDENT_AMBULATORY_CARE_PROVIDER_SITE_OTHER): Payer: BC Managed Care – PPO | Admitting: Surgery

## 2016-06-25 ENCOUNTER — Telehealth: Payer: Self-pay | Admitting: Surgery

## 2016-06-25 VITALS — BP 151/87 | HR 84 | Temp 98.7°F | Ht 65.0 in | Wt 138.2 lb

## 2016-06-25 DIAGNOSIS — K629 Disease of anus and rectum, unspecified: Secondary | ICD-10-CM

## 2016-06-25 DIAGNOSIS — K6289 Other specified diseases of anus and rectum: Secondary | ICD-10-CM

## 2016-06-25 NOTE — Progress Notes (Signed)
Patient ID: Hailey Guerrero, female   DOB: 1960/08/29, 55 y.o.   MRN: [id number]  HPI CHARLES SHARER is a 55 y.o. female with several month history of vague abdominal pain, nausea and abdominal distention. She also reports some occasional hematochezia about 1 episode a month. Some minimal weight loss. She does have great cardiovascular performance able to do more than 4 Mets of activity without any shortness of breath or chest pain. She does drink a bottle 1 of wine a day. 4 of the workup as CT scan was initially performed showing some nonspecific rectal lesion that prompted further evaluation by GI. She underwent EGD and colonoscopy by Dr. 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 MRI was ordered and personally reviewed and discussed with the radiologist. No evidence of nodal metastasis and no evidence of involvement in the sphincteric complex. Actually no evidence of a discrete rectal mass was visualized. Only previous operation is an abdominal hysterectomy. Personal review all imaging studies including MRI, CT scan and endoscopic images.  HPI  Past Medical History:  Diagnosis Date  . Anxiety   . Depression     Past Surgical History:  Procedure Laterality Date  . ABDOMINAL HYSTERECTOMY  2012  . COLONOSCOPY WITH PROPOFOL N/A 05/30/2016   Procedure: COLONOSCOPY WITH PROPOFOL;  Surgeon: Jonathon Bellows, MD;  Location: [facility] ENDOSCOPY;  Service: Endoscopy;  Laterality: N/A;  . ESOPHAGOGASTRODUODENOSCOPY (EGD) WITH PROPOFOL N/A 05/30/2016   Procedure: ESOPHAGOGASTRODUODENOSCOPY (EGD) WITH PROPOFOL;  Surgeon: Jonathon Bellows, MD;  Location: [facility] ENDOSCOPY;  Service: Endoscopy;  Laterality: N/A;    Family History  Problem Relation Age of Onset  . Healthy Mother   . Healthy Father   . Heart disease Maternal Grandfather   . Breast cancer Cousin   . Bladder Cancer Neg Hx   . Kidney cancer  Neg Hx   . Colon cancer Neg Hx     Social History Social History  Substance Use Topics  . Smoking status: Never Smoker  . Smokeless tobacco: Never Used  . Alcohol use 0.0 oz/week     Comment: 1 bottle of wine daily    No Active Allergies  Current Outpatient Prescriptions  Medication Sig Dispense Refill  . Prenatal Vit-Fe Fumarate-FA (M-VIT) tablet Take 1 tablet by mouth daily.    . RaNITidine HCl (ACID REDUCER PO) Take 1 tablet by mouth as needed.     No current facility-administered medications for this visit.      Review of Systems A 10 point review of systems was asked and was negative except for the information on the HPI  Physical Exam Blood pressure (!) 151/87, pulse 84, temperature 98.7 F (37.1 C), temperature source Oral, height 5\' 5"  (1.651 m), weight 62.7 kg (138 lb 3.2 oz). CONSTITUTIONAL: NAD EYES: Pupils are equal, round, and reactive to light, Sclera are non-icteric. EARS, NOSE, MOUTH AND THROAT: The oropharynx is clear. The oral mucosa is pink and moist. Hearing is intact to voice. LYMPH NODES:  Lymph nodes in the neck are normal. RESPIRATORY:  Lungs are clear. There is normal respiratory effort, with equal breath sounds bilaterally, and without pathologic use of accessory muscles. CARDIOVASCULAR: Heart is regular without murmurs, gallops, or rubs. GI: The abdomen is soft, nontender, and nondistended. There are no palpable masses. There is no hepatosplenomegaly. There are normal bowel sounds in all quadrants. Rectal: 6 cms soft mass on posterior rectum I was able to visualize  it well with the rigid scope but unable to assess the complete endorectal circumference. MUSCULOSKELETAL: Normal muscle strength and tone. No cyanosis or edema.   SKIN: Turgor is good and there are no pathologic skin lesions or ulcers. NEUROLOGIC: Motor and sensation is grossly normal. Cranial nerves are grossly intact. PSYCH:  Oriented to person, place and time. Affect is normal.  Data  Reviewed  I have personally reviewed the patient's imaging, laboratory findings and medical records.    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 MRI reviewed, d/w radiologist in detail. No evidence of nodal involvement and actually unable to visualize mass, likely given the soft density of it.  Physical exam I can feel the mass and is about 6 cm from the anal verge and is posterior. Given its proximity to the sphincter muscle I do recommend a specialized referral to colorectal surgery at [facility] to give her the best chance of a sphincter preserving oncologic operation. Discussed with the patient in detail and I do think that a radical resection is in her best interest given the fact that this mass is partially circumferential and with high-grade dysplasia that more than likely will be upgraded in the final pathological specimen. Also discussed with her about further diagnosis test including a PET/CT and and I do agree with her that probably [facility] should go ahead and get this since she is going to have the final treatment over there. We also discussed about the role of endoscopic the costal resection and again as stated above I do think that this lesion will need an oncologic Resection. I do think that From surgery given its early stage is indicated as opposed to neoadjuvant chemotherapy/radiation therapy. Extensive counseling provided to the patient and all the questions were answered. Plan to refer to [facility] given that an ultra low anastomosis w radical resection will be required .  Caroleen Hamman, MD FACS General Surgeon 06/25/2016, 11:18 AM

## 2016-06-25 NOTE — Telephone Encounter (Signed)
A referral has been faxed to [facility] for Rectal Mass per Dr Dahlia Byes.   All clinic notes, labs, imaging and pathology has been faxed as well.   Fax # [phone] Phone# [phone] Festus Aloe will review the records and then call patient within 5-7 business days to make an appointment.

## 2016-06-25 NOTE — Patient Instructions (Signed)
We will send your referral to [facility] Colorectal. Just know that they will contact you to schedule your appointment. However, if you don't hear from them within a week, please call us so we could call them.

## 2017-11-29 IMAGING — CT CT ABD-PEL WO/W CM
1 of 4 series · 8 of 32 positions shown, 13 images · IV contrast (APPLIED)
Comparison: None.

CLINICAL DATA: Micro hematuria

EXAM:
CT ABDOMEN AND PELVIS WITHOUT AND WITH CONTRAST
TECHNIQUE: Multidetector CT imaging of the abdomen and pelvis was performed
following the standard protocol before and following the bolus
administration of intravenous contrast.
CONTRAST:  125 mL Isovue

[Series 12: axial delay · axial · delayed · 0.88mm/px · z∈[-461,-91]mm · 8 of 96 slices shown, 13 images]
[im 11/96  soft-tissue]
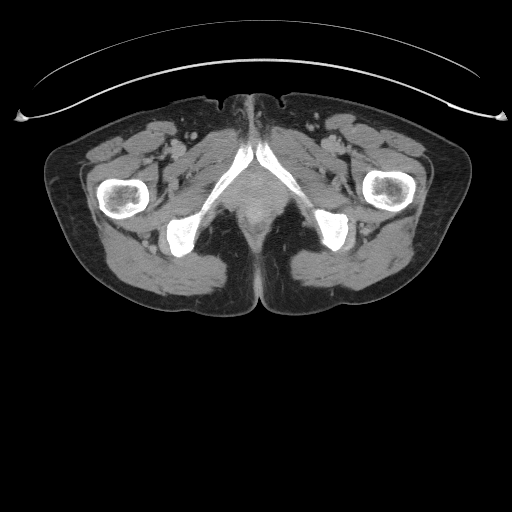
[im 11/96  bone]
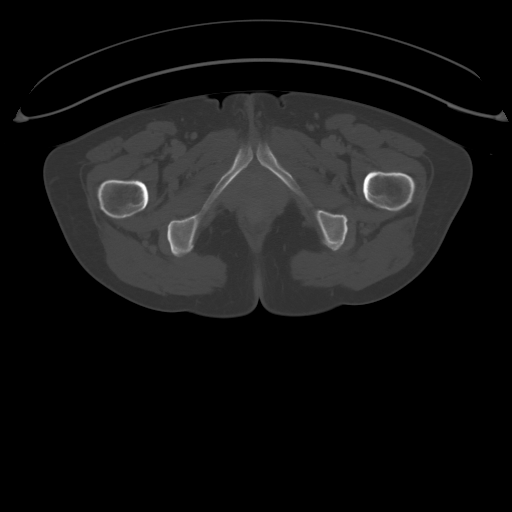
[im 22/96  soft-tissue]
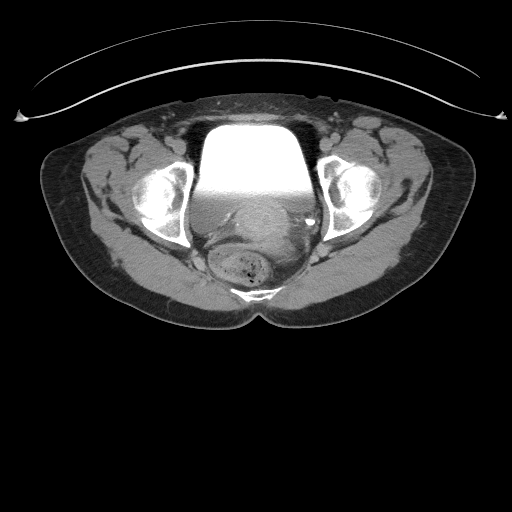
[im 32/96  soft-tissue]
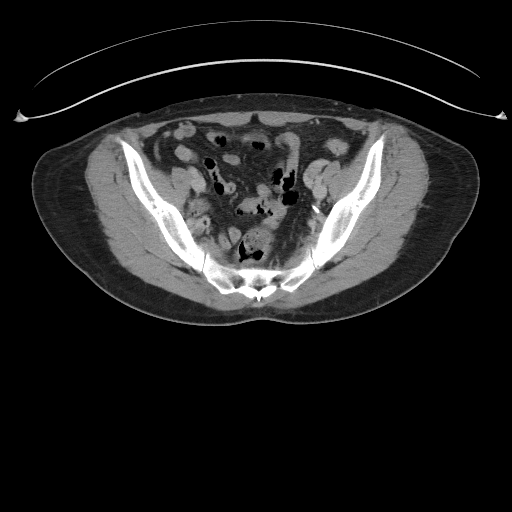
[im 43/96  soft-tissue]
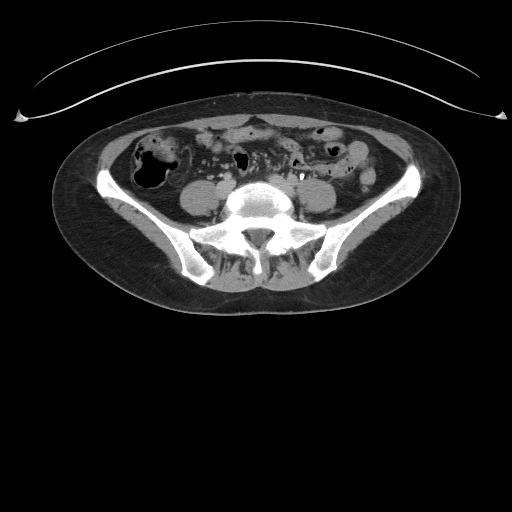
[im 53/96  soft-tissue]
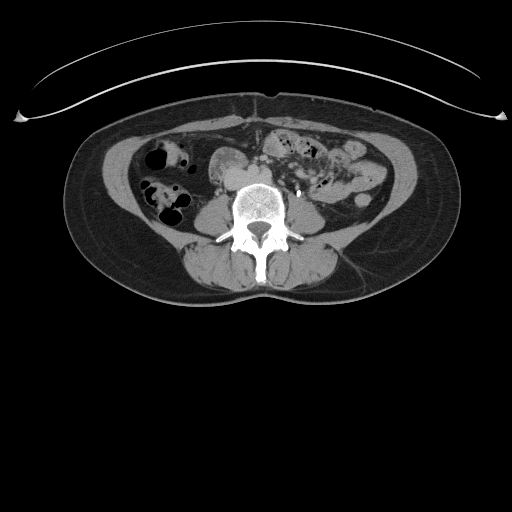
[im 53/96  lung]
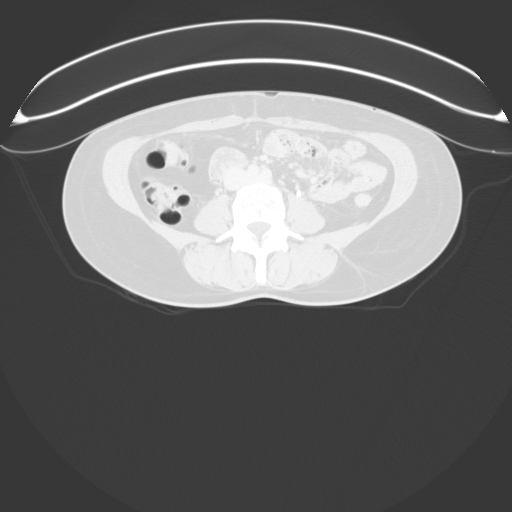
[im 64/96  soft-tissue]
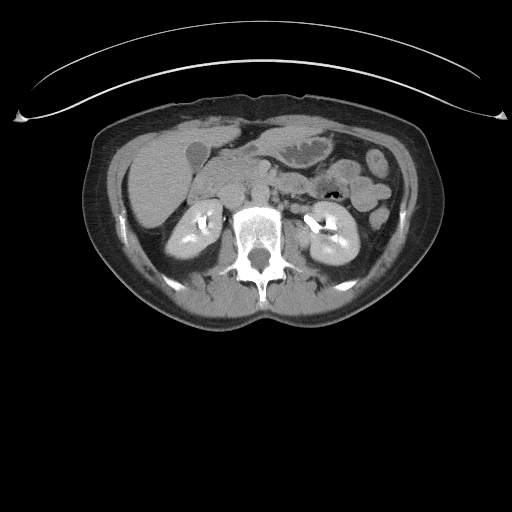
[im 64/96  lung]
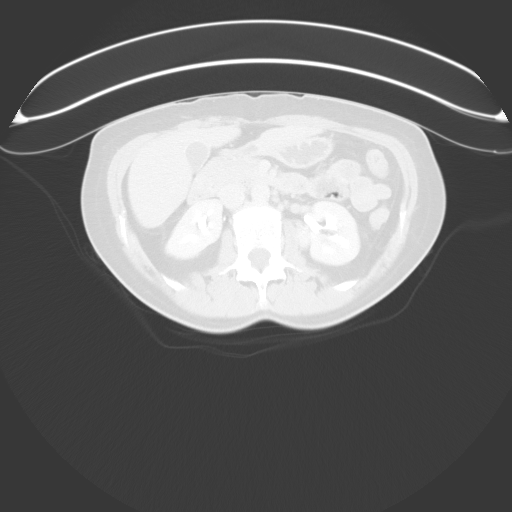
[im 74/96  soft-tissue]
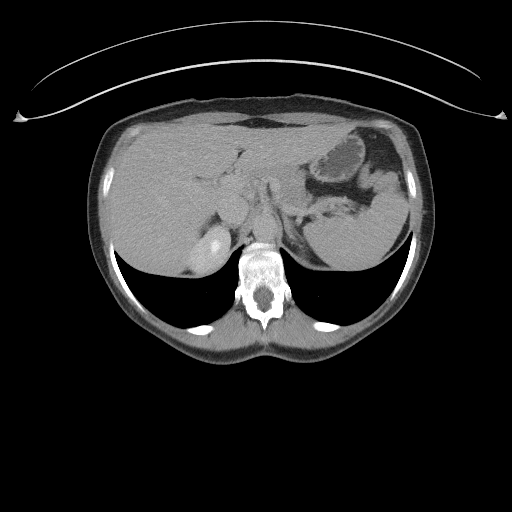
[im 74/96  lung]
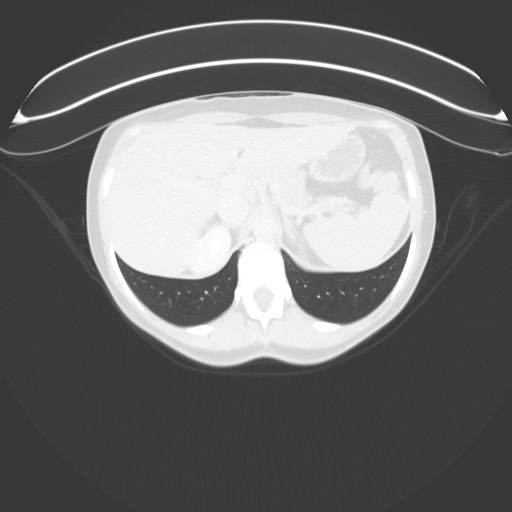
[im 85/96  soft-tissue]
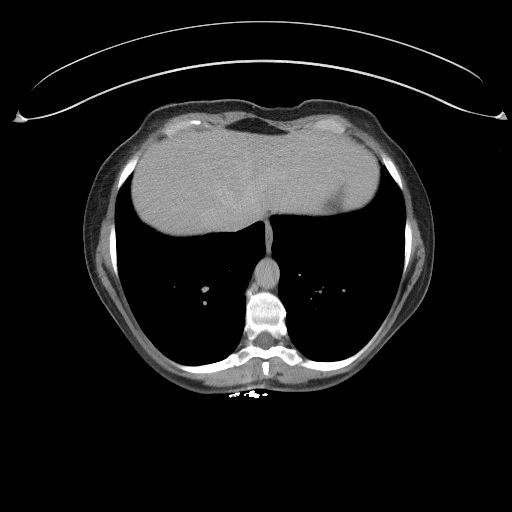
[im 85/96  lung]
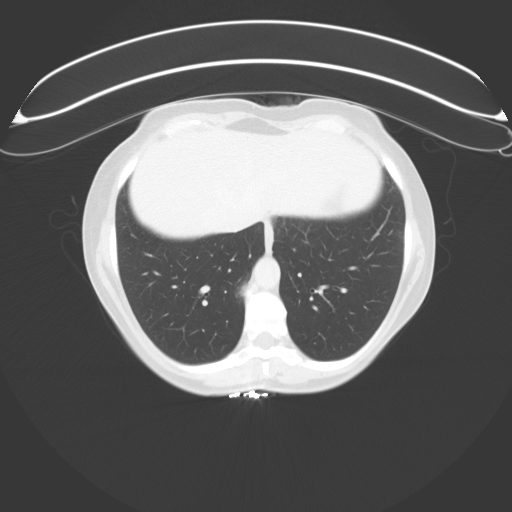

[8 of 32 positions shown; findings below may reference images not displayed]

FINDINGS: Lower chest: Lung bases are clear.

Hepatobiliary: No focal hepatic lesion. No biliary duct dilatation.
Gallbladder is normal. Common bile duct is normal.

Pancreas: Pancreas is normal. No ductal dilatation. No pancreatic
inflammation.

Spleen: Normal spleen

Adrenals/urinary tract: Adrenal glands are normal. No
nephrolithiasis or ureterolithiasis. No enhancing renal cortical
lesion. No filling defects within collecting systems or ureters.

Stomach/Bowel: Stomach, small bowel cecum normal. The colon
demonstrates several diverticula of the descending colon without
acute inflammation. Nodularity along the anterior wall of the
proximal rectum (image 74 series 4, image 61, series 10)

Vascular/Lymphatic: Small periaortic lymph nodes are not
pathologically enlarged. Aorta is normal. LEFT external iliac lymph
node measures 7 mm.

Reproductive: Post hysterectomy.  Normal adnexa.

Other: No free fluid.

Musculoskeletal: No aggressive osseous lesion.
IMPRESSION: 1. No explanation for hematuria. No nephrolithiasis,
ureterolithiasis, enhancing renal cortical lesion, or filling
defects within the collecting systems.
2. No bladder stones or filling defects in the bladder which does
not excluded a bladder lesion.
3. Nodularity along the anterior wall of the proximal rectum.
Recommend colonoscopy to evaluate for polypoid disease.
4. Mild Diverticulosis of the descending colon.
These results will be called to the ordering clinician or
representative by the Radiologist Assistant, and communication
documented in the PACS or zVision Dashboard.

## 2018-02-15 ENCOUNTER — Other Ambulatory Visit: Payer: Self-pay | Admitting: Internal Medicine

## 2018-02-15 DIAGNOSIS — R1011 Right upper quadrant pain: Secondary | ICD-10-CM

## 2018-02-17 ENCOUNTER — Other Ambulatory Visit: Payer: Self-pay | Admitting: Internal Medicine

## 2018-02-17 DIAGNOSIS — R1011 Right upper quadrant pain: Secondary | ICD-10-CM

## 2018-02-18 ENCOUNTER — Ambulatory Visit
Admission: RE | Admit: 2018-02-18 | Discharge: 2018-02-18 | Disposition: A | Discharge status: Home / Self Care | Payer: BC Managed Care – PPO | Source: Ambulatory Visit | Attending: Internal Medicine | Admitting: Internal Medicine

## 2018-02-18 DIAGNOSIS — R143 Flatulence: Secondary | ICD-10-CM | POA: Diagnosis not present

## 2018-02-18 DIAGNOSIS — R1011 Right upper quadrant pain: Secondary | ICD-10-CM

## 2018-02-18 DIAGNOSIS — R932 Abnormal findings on diagnostic imaging of liver and biliary tract: Secondary | ICD-10-CM | POA: Diagnosis not present

## 2018-06-22 ENCOUNTER — Ambulatory Visit (INDEPENDENT_AMBULATORY_CARE_PROVIDER_SITE_OTHER): Payer: BC Managed Care – PPO | Admitting: Maternal Newborn

## 2018-06-22 ENCOUNTER — Encounter: Payer: Self-pay | Admitting: Maternal Newborn

## 2018-06-22 VITALS — BP 152/90 | HR 109 | Ht 65.0 in | Wt 141.0 lb

## 2018-06-22 DIAGNOSIS — R300 Dysuria: Secondary | ICD-10-CM

## 2018-06-22 DIAGNOSIS — N390 Urinary tract infection, site not specified: Secondary | ICD-10-CM | POA: Diagnosis not present

## 2018-06-22 LAB — POCT URINALYSIS DIPSTICK
Bilirubin, UA: NEGATIVE
GLUCOSE UA: NEGATIVE
Ketones, UA: NEGATIVE
NITRITE UA: NEGATIVE
PROTEIN UA: NEGATIVE
SPEC GRAV UA: 1.01 (ref 1.010–1.025)
Urobilinogen, UA: 0.2 E.U./dL
pH, UA: 6 (ref 5.0–8.0)

## 2018-06-22 MED ORDER — CEPHALEXIN 500 MG PO CAPS: 500.0000 mg | ORAL_CAPSULE | Freq: Two times a day (BID) | ORAL | 0 refills | Status: AC

## 2018-06-22 NOTE — Progress Notes (Signed)
Obstetrics & Gynecology Office Visit   Chief Complaint:  Chief Complaint  Patient presents with  . Urinary Tract Infection    Burning with urination, frequent urination     History of Present Illness: Hailey Guerrero started feeling burning with urination this morning, and has had urinary frequency today. She took Azo, which helped with the dysuria. She does not have any hematuria, back pain, or abnormal discharge.  Review of Systems: Review of systems negative unless otherwise noted in HPI.  Past Medical History:  Past Medical History:  Diagnosis Date  . Anxiety   . Depression   . IBS (irritable bowel syndrome)     Past Surgical History:  Past Surgical History:  Procedure Laterality Date  . ABDOMINAL HYSTERECTOMY  2012  . COLONOSCOPY WITH PROPOFOL N/A 05/30/2016   Procedure: COLONOSCOPY WITH PROPOFOL;  Surgeon: Jonathon Bellows, MD;  Location: [facility] ENDOSCOPY;  Service: Endoscopy;  Laterality: N/A;  . ESOPHAGOGASTRODUODENOSCOPY (EGD) WITH PROPOFOL N/A 05/30/2016   Procedure: ESOPHAGOGASTRODUODENOSCOPY (EGD) WITH PROPOFOL;  Surgeon: Jonathon Bellows, MD;  Location: [facility] ENDOSCOPY;  Service: Endoscopy;  Laterality: N/A;    Gynecologic History: No LMP recorded. Patient has had a hysterectomy.  Obstetric History: [id number]  Family History:  Family History  Problem Relation Age of Onset  . Healthy Mother   . Healthy Father   . Heart disease Maternal Grandfather   . Breast cancer Cousin   . Bladder Cancer Neg Hx   . Kidney cancer Neg Hx   . Colon cancer Neg Hx     Social History:  Social History   Socioeconomic History  . Marital status: Divorced    Spouse name: Not on file  . Number of children: Not on file  . Years of education: Not on file  . Highest education level: Not on file  Occupational History  . Not on file  Social Needs  . Financial resource strain: Not on file  . Food insecurity:    Worry: Not on file    Inability: Not on file  . Transportation needs:    Medical:  Not on file    Non-medical: Not on file  Tobacco Use  . Smoking status: Never Smoker  . Smokeless tobacco: Never Used  Substance and Sexual Activity  . Alcohol use: Yes    Alcohol/week: 0.0 standard drinks    Comment: 1 bottle of wine daily  . Drug use: No  . Sexual activity: Yes    Birth control/protection: Surgical    Comment: Hysterectomy   Lifestyle  . Physical activity:    Days per week: Not on file    Minutes per session: Not on file  . Stress: Not on file  Relationships  . Social connections:    Talks on phone: Not on file    Gets together: Not on file    Attends religious service: Not on file    Active member of club or organization: Not on file    Attends meetings of clubs or organizations: Not on file    Relationship status: Not on file  . Intimate partner violence:    Fear of current or ex partner: Not on file    Emotionally abused: Not on file    Physically abused: Not on file    Forced sexual activity: Not on file  Other Topics Concern  . Not on file  Social History Narrative  . Not on file    Allergies:  No Known Allergies  Medications: Prior to Admission medications  Medication Sig Start Date End Date Taking? Authorizing Provider  ALPRAZolam (XANAX) 0.25 MG tablet TAKE 1 TABLET BY MOUTH NIGHTLY AS NEEDED FOR SLEEP 05/26/18  Yes [provider]  desipramine (NORPRAMIN) 25 MG tablet TAKE 2 TABLETS (50 MG TOTAL) BY MOUTH TWO (2) TIMES A DAY. 06/19/18  Yes [provider]  Prenatal Vit-Fe Fumarate-FA (M-VIT) tablet Take 1 tablet by mouth daily.   Yes [provider]  RaNITidine HCl (ACID REDUCER PO) Take 1 tablet by mouth as needed.   Yes [provider]    Physical Exam Vitals:  Vitals:   06/22/18 1526  BP: (!) 152/90  Pulse: (!) 109   No LMP recorded. Patient has had a hysterectomy.  General: NAD HEENT: normocephalic, anicteric Pulmonary: No increased work of breathing Neurologic: Grossly  intact Psychiatric: mood appropriate, affect full  Assessment: 57 y.o. [id number] with a possible urinary tract infection.  Plan: Problem List Items Addressed This Visit    None    Visit Diagnoses    Burning with urination    -  Primary   Relevant Orders   Urine Culture (Completed)   POCT Urinalysis Dipstick (Completed)   Urinary tract infection without hematuria, site unspecified       Relevant Medications   cephALEXin (KEFLEX) 500 MG capsule     UA shows a small amount of leukocytes. Will send urine culture. Explained options of beginning empiric treatment or waiting for results of urine culture. She desires to begin treatment now due to bothersome symptoms. Will notify if culture results necessitate change in therapy.  Avel Sensor, CNM 06/21/2018

## 2018-06-24 LAB — URINE CULTURE: ORGANISM ID, BACTERIA: NO GROWTH

## 2019-05-02 ENCOUNTER — Other Ambulatory Visit: Payer: Self-pay | Admitting: Internal Medicine

## 2019-05-02 DIAGNOSIS — Z0000 Encounter for general adult medical examination without abnormal findings: Secondary | ICD-10-CM

## 2019-05-02 DIAGNOSIS — Z1231 Encounter for screening mammogram for malignant neoplasm of breast: Secondary | ICD-10-CM

## 2019-08-23 ENCOUNTER — Ambulatory Visit
Admission: RE | Admit: 2019-08-23 | Discharge: 2019-08-23 | Disposition: A | Discharge status: Home / Self Care | Payer: BC Managed Care – PPO | Source: Ambulatory Visit | Attending: Internal Medicine | Admitting: Internal Medicine

## 2019-08-23 DIAGNOSIS — Z0000 Encounter for general adult medical examination without abnormal findings: Secondary | ICD-10-CM | POA: Insufficient documentation

## 2019-08-23 DIAGNOSIS — Z1231 Encounter for screening mammogram for malignant neoplasm of breast: Secondary | ICD-10-CM | POA: Insufficient documentation

## 2019-09-17 ENCOUNTER — Ambulatory Visit: Payer: BC Managed Care – PPO | Attending: Internal Medicine

## 2019-09-17 DIAGNOSIS — Z23 Encounter for immunization: Secondary | ICD-10-CM

## 2019-09-17 NOTE — Progress Notes (Signed)
   Covid-19 Vaccination Clinic  Name:  Hailey Guerrero    MRN: [id number] DOB: 03/24/1961  09/17/2019  [name] was observed post Covid-19 immunization for 15 minutes without incident. She was provided with Vaccine Information Sheet and instruction to access the V-Safe system.   [name] was instructed to call 911 with any severe reactions post vaccine: Marland Kitchen Difficulty breathing  . Swelling of face and throat  . A fast heartbeat  . A bad rash all over body  . Dizziness and weakness   Immunizations Administered    Name Date Dose VIS Date Route   Pfizer COVID-19 Vaccine 09/17/2019  8:30 AM 0.3 mL 06/24/2019 Intramuscular   Manufacturer: [place]   Lot: [id number]   [place]: [id number]

## 2019-10-11 ENCOUNTER — Ambulatory Visit: Payer: BC Managed Care – PPO | Attending: Internal Medicine

## 2019-10-11 DIAGNOSIS — Z23 Encounter for immunization: Secondary | ICD-10-CM

## 2019-10-11 NOTE — Progress Notes (Signed)
   Covid-19 Vaccination Clinic  Name:  Hailey Guerrero    MRN: [id number] DOB: 1960/11/24  10/11/2019  [name] was observed post Covid-19 immunization for 15 minutes without incident. She was provided with Vaccine Information Sheet and instruction to access the V-Safe system.   [name] was instructed to call 911 with any severe reactions post vaccine: Marland Kitchen Difficulty breathing  . Swelling of face and throat  . A fast heartbeat  . A bad rash all over body  . Dizziness and weakness   Immunizations Administered    Name Date Dose VIS Date Route   Pfizer COVID-19 Vaccine 10/11/2019  3:35 PM 0.3 mL 06/24/2019 Intramuscular   Manufacturer: [place]   Lot: [id number]   [place]: [id number]

## 2019-11-23 ENCOUNTER — Ambulatory Visit: Payer: BC Managed Care – PPO | Admitting: Obstetrics and Gynecology

## 2019-11-23 ENCOUNTER — Encounter: Payer: Self-pay | Admitting: Obstetrics and Gynecology

## 2019-11-23 ENCOUNTER — Other Ambulatory Visit: Payer: Self-pay

## 2019-11-23 ENCOUNTER — Ambulatory Visit (INDEPENDENT_AMBULATORY_CARE_PROVIDER_SITE_OTHER): Payer: BC Managed Care – PPO | Admitting: Obstetrics and Gynecology

## 2019-11-23 VITALS — BP 140/100 | Ht 65.0 in | Wt 145.0 lb

## 2019-11-23 DIAGNOSIS — N3001 Acute cystitis with hematuria: Secondary | ICD-10-CM | POA: Diagnosis not present

## 2019-11-23 LAB — POCT URINALYSIS DIPSTICK: Spec Grav, UA: 1.015 (ref 1.010–1.025)

## 2019-11-23 MED ORDER — NITROFURANTOIN MONOHYD MACRO 100 MG PO CAPS: 100.0000 mg | ORAL_CAPSULE | Freq: Two times a day (BID) | ORAL | 0 refills | Status: AC

## 2019-11-23 NOTE — Patient Instructions (Signed)
I value your feedback and entrusting us with your care. If you get a [facility] patient survey, I would appreciate you taking the time to let us know about your experience today. Thank you!  As of June 23, 2019, your lab results will be released to your MyChart immediately, before I even have a chance to see them. Please give me time to review them and contact you if there are any abnormalities. Thank you for your patience.  

## 2019-11-23 NOTE — Progress Notes (Signed)
Tracie Harrier, MD   Chief Complaint  Patient presents with  . Urinary Tract Infection    urgency, frequency and burning urinating since yesterday    HPI:      [name] is a 59 y.o. [id number] whose LMP was No LMP recorded. Patient has had a hysterectomy., presents today for UTI sx of urinary frequency/urgency and dysuria since yesterday. Taking AZO. No hematuria, LBP, pelvic pain, fevers. No vag sx, no vag bleeding. Hx of UTIs in past. No recent abx use.    Past Medical History:  Diagnosis Date  . Anxiety   . Depression   . IBS (irritable bowel syndrome)     Past Surgical History:  Procedure Laterality Date  . ABDOMINAL HYSTERECTOMY  2012  . COLONOSCOPY WITH PROPOFOL N/A 05/30/2016   Procedure: COLONOSCOPY WITH PROPOFOL;  Surgeon: Jonathon Bellows, MD;  Location: [facility] END[facility]COPY;  Service: Endoscopy;  Laterality: N/A;  . ESOPHAGOGASTRODUODENOSCOPY (EGD) WITH PROPOFOL N/A 05/30/2016   Procedure: ESOPHAGOGASTRODUODENOSCOPY (EGD) WITH PROPOFOL;  Surgeon: Jonathon Bellows, MD;  Location: [facility];  Service: Endoscopy;  Laterality: N/A;    Family History  Problem Relation Age of Onset  . Healthy Mother   . Healthy Father   . Heart disease Maternal Grandfather   . Breast cancer Cousin        twice, 4s and 20s  . Bladder Cancer Neg Hx   . Kidney cancer Neg Hx   . Colon cancer Neg Hx     Social History   Socioeconomic History  . Marital status: Divorced    Spouse name: Not on file  . Number of children: Not on file  . Years of education: Not on file  . Highest education level: Not on file  Occupational History  . Not on file  Tobacco Use  . Smoking status: Never Smoker  . Smokeless tobacco: Never Used  Substance and Sexual Activity  . Alcohol use: Yes    Alcohol/week: 0.0 standard drinks    Comment: 1 bottle of wine daily  . Drug use: No  . Sexual activity: Yes    Birth control/protection: Surgical    Comment: Hysterectomy   Other Topics Concern  . Not  on file  Social History Narrative  . Not on file   Social Determinants of Health   Financial Resource Strain:   . Difficulty of Paying Living Expenses:   Food Insecurity:   . Worried About Charity fundraiser in the Last Year:   . Arboriculturist in the Last Year:   Transportation Needs:   . Film/video editor (Medical):   Marland Kitchen Lack of Transportation (Non-Medical):   Physical Activity:   . Days of Exercise per Week:   . Minutes of Exercise per Session:   Stress:   . Feeling of Stress :   Social Connections:   . Frequency of Communication with Friends and Family:   . Frequency of Social Gatherings with Friends and Family:   . Attends Religious Services:   . Active Member of Clubs or Organizations:   . Attends Archivist Meetings:   Marland Kitchen Marital Status:   Intimate Partner Violence:   . Fear of Current or Ex-Partner:   . Emotionally Abused:   Marland Kitchen Physically Abused:   . Sexually Abused:     Outpatient Medications Prior to Visit  Medication Sig Dispense Refill  . ALPRAZolam (XANAX) 0.25 MG tablet TAKE 1 TABLET BY MOUTH NIGHTLY AS NEEDED FOR SLEEP  1  .  desipramine (NORPRAMIN) 25 MG tablet TAKE 2 TABLETS (50 MG TOTAL) BY MOUTH TWO (2) TIMES A DAY.  2  . Prenatal Vit-Fe Fumarate-FA (M-VIT) tablet Take 1 tablet by mouth daily.    . RaNITidine HCl (ACID REDUCER PO) Take 1 tablet by mouth as needed.     No facility-administered medications prior to visit.      ROS:  Review of Systems  Constitutional: Negative for fever.  Gastrointestinal: Negative for blood in stool, constipation, diarrhea, nausea and vomiting.  Genitourinary: Positive for dysuria, frequency and urgency. Negative for dyspareunia, flank pain, hematuria, vaginal bleeding, vaginal discharge and vaginal pain.  Musculoskeletal: Negative for back pain.  Skin: Negative for rash.    OBJECTIVE:   Vitals:  BP (!) 140/100   Ht 5\' 5"  (1.651 m)   Wt 145 lb (65.8 kg)   BMI 24.13 kg/m   Physical  Exam Vitals reviewed.  Constitutional:      Appearance: She is well-developed. She is not ill-appearing or toxic-appearing.  Pulmonary:     Effort: Pulmonary effort is normal.  Abdominal:     Tenderness: There is no right CVA tenderness or left CVA tenderness.  Musculoskeletal:        General: Normal range of motion.     Cervical back: Normal range of motion.  Neurological:     General: No focal deficit present.     Mental Status: She is alert and oriented to person, place, and time.     Cranial Nerves: No cranial nerve deficit.  Psychiatric:        Behavior: Behavior normal.        Thought Content: Thought content normal.        Judgment: Judgment normal.     Results: Results for orders placed or performed in visit on 11/23/19 (from the past 24 hour(s))  POCT Urinalysis Dipstick     Status: Abnormal   Collection Time: 11/23/19  2:10 PM  Result Value Ref Range   Color, UA orange    Clarity, UA clear    Glucose, UA     Bilirubin, UA     Ketones, UA     Spec Grav, UA 1.015 1.010 - 1.025   Blood, UA mod    pH, UA     Protein, UA     Urobilinogen, UA     Nitrite, UA     Leukocytes, UA Small (1+) (A) Negative   Appearance     Odor     PT TAKING AZO  Assessment/Plan: Acute cystitis with hematuria - Plan: nitrofurantoin, macrocrystal-monohydrate, (MACROBID) 100 MG capsule, POCT Urinalysis Dipstick, Urine Culture; Pos sx/UA. Rx macrobid. Check C&S. F/u prn.    Meds ordered this encounter  Medications  . nitrofurantoin, macrocrystal-monohydrate, (MACROBID) 100 MG capsule    Sig: Take 1 capsule (100 mg total) by mouth 2 (two) times daily for 5 days.    Dispense:  10 capsule    Refill:  0    Order Specific Question:   Supervising Provider    Answer:   Gae Dry [id number]      Return if symptoms worsen or fail to improve.  Ozro Russett B. Viviene Thurston, PA-C 11/23/2019 2:11 PM

## 2019-11-25 LAB — URINE CULTURE

## 2021-12-09 NOTE — Progress Notes (Unsigned)
PCP: Tracie Harrier, MD   No chief complaint on file.   HPI:      [name] is a 61 y.o. [id number] whose LMP was No LMP recorded. Patient has had a hysterectomy., presents today for her annual examination.  Her menses are {norm/abn:715}, lasting {number: 22536} days.  Dysmenorrhea {dysmen:716}. She {does:18564} have intermenstrual bleeding. She {does:18564} have vasomotor sx.   Sex activity: {sex active: 315163}. She {does:18564} have vaginal dryness.  Last Pap: {DXIP:382505397}  Results were: {norm/abn:16707::"no abnormalities"} /neg HPV DNA.  Hx of STDs: {STD hx:14358}  Last mammogram: 08/23/19  Results were: normal--routine follow-up in 12 months There is no FH of breast cancer. There is no FH of ovarian cancer. The patient {does:18564} do self-breast exams.  Colonoscopy: 3/22 at [facility];  Repeat due after 10*** years.   Tobacco use: {tob:20664} Alcohol use: {Alcohol:11675} No drug use Exercise: {exercise:31265}  She {does:18564} get adequate calcium and Vitamin D in her diet.  Labs with PCP.   Patient Active Problem List   Diagnosis Date Noted   Neoplasm of digestive system    Diverticulosis of large intestine without diverticulitis    Bloating symptom 05/21/2016   Abdominal pain, epigastric 05/21/2016   Abnormal findings-gastrointestinal tract 05/21/2016   Microscopic hematuria 05/12/2016   Mastalgia 04/11/2015    Past Surgical History:  Procedure Laterality Date   ABDOMINAL HYSTERECTOMY  2012   COLONOSCOPY WITH PROPOFOL N/A 05/30/2016   Procedure: COLONOSCOPY WITH PROPOFOL;  Surgeon: Jonathon Bellows, MD;  Location: [facility] END[facility];  Service: Endoscopy;  Laterality: N/A;   ESOPHAGOGASTRODUODENOSCOPY (EGD) WITH PROPOFOL N/A 05/30/2016   Procedure: ESOPHAGOGASTRODUODENOSCOPY (EGD) WITH PROPOFOL;  Surgeon: Jonathon Bellows, MD;  Location: [facility];  Service: Endoscopy;  Laterality: N/A;    Family History  Problem Relation Age of Onset   Healthy Mother     Healthy Father    Heart disease Maternal Grandfather    Breast cancer Cousin        twice, 16s and 77s   Bladder Cancer Neg Hx    Kidney cancer Neg Hx    Colon cancer Neg Hx     Social History   Socioeconomic History   Marital status: Divorced    Spouse name: Not on file   Number of children: Not on file   Years of education: Not on file   Highest education level: Not on file  Occupational History   Not on file  Tobacco Use   Smoking status: Never   Smokeless tobacco: Never  Vaping Use   Vaping Use: Never used  Substance and Sexual Activity   Alcohol use: Yes    Alcohol/week: 0.0 standard drinks    Comment: 1 bottle of wine daily   Drug use: No   Sexual activity: Yes    Birth control/protection: Surgical    Comment: Hysterectomy   Other Topics Concern   Not on file  Social History Narrative   Not on file   Social Determinants of Health   Financial Resource Strain: Not on file  Food Insecurity: Not on file  Transportation Needs: Not on file  Physical Activity: Not on file  Stress: Not on file  Social Connections: Not on file  Intimate Partner Violence: Not on file     Current Outpatient Medications:    ALPRAZolam (XANAX) 0.25 MG tablet, TAKE 1 TABLET BY MOUTH NIGHTLY AS NEEDED FOR SLEEP, Disp: , Rfl: 1   desipramine (NORPRAMIN) 25 MG tablet, TAKE 2 TABLETS (50 MG TOTAL) BY MOUTH TWO (  2) TIMES A DAY., Disp: , Rfl: 2   Prenatal Vit-Fe Fumarate-FA (M-VIT) tablet, Take 1 tablet by mouth daily., Disp: , Rfl:    RaNITidine HCl (ACID REDUCER PO), Take 1 tablet by mouth as needed., Disp: , Rfl:      ROS:  Review of Systems BREAST: No symptoms    Objective: There were no vitals taken for this visit.   OBGyn Exam  Results: No results found for this or any previous visit (from the past 24 hour(s)).  Assessment/Plan:  No diagnosis found.   No orders of the defined types were placed in this encounter.           GYN counsel {counseling: 16159}     F/U  No follow-ups on file.  Zariya Minner B. Isma Tietje, PA-C 12/09/2021 9:25 PM

## 2021-12-10 ENCOUNTER — Encounter: Payer: Self-pay | Admitting: Obstetrics and Gynecology

## 2021-12-10 ENCOUNTER — Other Ambulatory Visit (HOSPITAL_COMMUNITY)
Admission: RE | Admit: 2021-12-10 | Discharge: 2021-12-10 | Disposition: A | Discharge status: Home / Self Care | Payer: BC Managed Care – PPO | Source: Ambulatory Visit | Attending: Obstetrics and Gynecology | Admitting: Obstetrics and Gynecology

## 2021-12-10 ENCOUNTER — Ambulatory Visit (INDEPENDENT_AMBULATORY_CARE_PROVIDER_SITE_OTHER): Payer: BC Managed Care – PPO | Admitting: Obstetrics and Gynecology

## 2021-12-10 VITALS — BP 124/80 | Ht 65.0 in | Wt 149.0 lb

## 2021-12-10 DIAGNOSIS — Z1151 Encounter for screening for human papillomavirus (HPV): Secondary | ICD-10-CM

## 2021-12-10 DIAGNOSIS — Z01419 Encounter for gynecological examination (general) (routine) without abnormal findings: Secondary | ICD-10-CM | POA: Diagnosis not present

## 2021-12-10 DIAGNOSIS — N644 Mastodynia: Secondary | ICD-10-CM

## 2021-12-10 DIAGNOSIS — R87612 Low grade squamous intraepithelial lesion on cytologic smear of cervix (LGSIL): Secondary | ICD-10-CM | POA: Insufficient documentation

## 2021-12-10 DIAGNOSIS — Z124 Encounter for screening for malignant neoplasm of cervix: Secondary | ICD-10-CM | POA: Insufficient documentation

## 2021-12-10 DIAGNOSIS — Z1231 Encounter for screening mammogram for malignant neoplasm of breast: Secondary | ICD-10-CM

## 2021-12-10 DIAGNOSIS — Z1211 Encounter for screening for malignant neoplasm of colon: Secondary | ICD-10-CM

## 2021-12-10 NOTE — Patient Instructions (Addendum)
I value your feedback and you entrusting us with your care. If you get a [facility] patient survey, I would appreciate you taking the time to let us know about your experience today. Thank you!  [facility]: [phone]      

## 2021-12-12 LAB — CYTOLOGY - PAP
Adequacy: ABSENT
Comment: NEGATIVE
Diagnosis: NEGATIVE
High risk HPV: NEGATIVE

## 2021-12-13 ENCOUNTER — Ambulatory Visit
Admission: RE | Admit: 2021-12-13 | Discharge: 2021-12-13 | Disposition: A | Discharge status: Home / Self Care | Payer: BC Managed Care – PPO | Source: Ambulatory Visit | Attending: Obstetrics and Gynecology | Admitting: Obstetrics and Gynecology

## 2021-12-13 DIAGNOSIS — Z1231 Encounter for screening mammogram for malignant neoplasm of breast: Secondary | ICD-10-CM | POA: Diagnosis not present

## 2023-10-20 ENCOUNTER — Other Ambulatory Visit: Payer: Self-pay | Admitting: Internal Medicine

## 2023-10-20 DIAGNOSIS — Z1231 Encounter for screening mammogram for malignant neoplasm of breast: Secondary | ICD-10-CM

## 2023-11-10 ENCOUNTER — Ambulatory Visit
Admission: RE | Admit: 2023-11-10 | Discharge: 2023-11-10 | Disposition: A | Discharge status: Home / Self Care | Payer: Self-pay | Source: Ambulatory Visit | Attending: Internal Medicine | Admitting: Internal Medicine

## 2023-11-10 DIAGNOSIS — Z1231 Encounter for screening mammogram for malignant neoplasm of breast: Secondary | ICD-10-CM | POA: Insufficient documentation

## 2023-12-05 ENCOUNTER — Encounter: Payer: Self-pay | Admitting: *Deleted

## 2023-12-05 ENCOUNTER — Ambulatory Visit
Admission: EM | Admit: 2023-12-05 | Discharge: 2023-12-05 | Disposition: A | Discharge status: Home / Self Care | Attending: Emergency Medicine | Admitting: Emergency Medicine

## 2023-12-05 DIAGNOSIS — R300 Dysuria: Secondary | ICD-10-CM | POA: Diagnosis present

## 2023-12-05 LAB — POCT URINALYSIS DIP (MANUAL ENTRY)
Bilirubin, UA: NEGATIVE
Glucose, UA: NEGATIVE mg/dL
Ketones, POC UA: NEGATIVE mg/dL
Nitrite, UA: NEGATIVE
Protein Ur, POC: NEGATIVE mg/dL
Spec Grav, UA: 1.015
Urobilinogen, UA: 0.2 U/dL
pH, UA: 6

## 2023-12-05 MED ORDER — NITROFURANTOIN MONOHYD MACRO 100 MG PO CAPS: 100.0000 mg | ORAL_CAPSULE | Freq: Two times a day (BID) | ORAL | 0 refills | Status: AC

## 2023-12-05 NOTE — ED Triage Notes (Signed)
 PT reports having auti

## 2023-12-05 NOTE — Discharge Instructions (Signed)
 Your urinalysis does not show bacteria at this time, your urine will be sent to the lab to determine exactly which bacteria is present, if any changes need to be made to your medications you will be notified  Begin use of Macrobid  twice daily for 5 days  You may use over-the-counter Azo to help minimize your symptoms until antibiotic removes bacteria, this medication will turn your urine orange  Increase your fluid intake through use of water  As always practice good hygiene, wiping front to back and avoidance of scented vaginal products to prevent further irritation  If symptoms continue to persist after use of medication or recur please follow-up with urgent care or your primary doctor as needed

## 2023-12-06 LAB — URINE CULTURE: Culture: NO GROWTH

## 2023-12-06 NOTE — ED Provider Notes (Signed)
 Hailey Guerrero    CSN: [id number] Arrival date & time: 12/05/23  1441      History   Chief Complaint Chief Complaint  Patient presents with   uti    HPI Hailey Guerrero is a 63 y.o. female.   Patient presents for evaluation of urinary frequency and dysuria beginning 1 day ago.  Has not attempted treatment.  Denies hematuria, abdominal, flank pain, fever or vaginal symptoms.  Past Medical History:  Diagnosis Date   Anxiety    Depression    IBS (irritable bowel syndrome)     Patient Active Problem List   Diagnosis Date Noted   LGSIL on Pap smear of cervix 12/10/2021   Neoplasm of digestive system    Diverticulosis of large intestine without diverticulitis    Bloating symptom 05/21/2016   Abdominal pain, epigastric 05/21/2016   Abnormal findings-gastrointestinal tract 05/21/2016   Microscopic hematuria 05/12/2016   Mastalgia 04/11/2015    Past Surgical History:  Procedure Laterality Date   COLONOSCOPY WITH PROPOFOL  N/A 05/30/2016   Procedure: COLONOSCOPY WITH PROPOFOL ;  Surgeon: Luke Salaam, MD;  Location: [facility];  Service: Endoscopy;  Laterality: N/A;   ESOPHAGOGASTRODUODENOSCOPY (EGD) WITH PROPOFOL  N/A 05/30/2016   Procedure: ESOPHAGOGASTRODUODENOSCOPY (EGD) WITH PROPOFOL ;  Surgeon: Luke Salaam, MD;  Location: [facility];  Service: Endoscopy;  Laterality: N/A;   LAPAROSCOPIC SUPRACERVICAL HYSTERECTOMY  2013   menorrhagia/leio    OB History     Gravida  3   Para  2   Term  2   Preterm      AB  1   Living  2      SAB  1   IAB      Ectopic  0   Multiple      Live Births  2        Obstetric Comments  1st Menstrual Cycle:  12 1st Pregnancy:  20           Home Medications    Prior to Admission medications   Medication Sig Start Date End Date Taking? Authorizing Provider  nitrofurantoin , macrocrystal-monohydrate, (MACROBID ) 100 MG capsule Take 1 capsule (100 mg total) by mouth 2 (two) times daily. 12/05/23  Yes Tavin Vernet,  Maybelle Spatz, NP  CRAN[place]Y PO Take by mouth.    [provider]  desipramine (NORPRAMIN) 25 MG tablet TAKE 2 TABLETS (50 MG TOTAL) BY MOUTH TWO (2) TIMES A DAY. 06/19/18   [provider]  escitalopram (LEXAPRO) 20 MG tablet Take 20 mg by mouth daily. 11/20/21   [provider]  rosuvastatin (CRESTOR) 5 MG tablet Take 5 mg by mouth 3 (three) times a week. 10/04/21   [provider]    Family History Family History  Problem Relation Age of Onset   Healthy Mother    Healthy Father    Heart disease Maternal Grandfather    Breast cancer Cousin        twice, 30s and 50s   Bladder Cancer Neg Hx    Kidney cancer Neg Hx    Colon cancer Neg Hx     Social History Social History   Tobacco Use   Smoking status: Never   Smokeless tobacco: Never  Vaping Use   Vaping status: Never Used  Substance Use Topics   Alcohol use: Yes    Alcohol/week: 0.0 standard drinks of alcohol    Comment: 1 bottle of wine daily   Drug use: No     Allergies   Patient has no  known allergies.   Review of Systems Review of Systems   Physical Exam Triage Vital Signs ED Triage Vitals [12/05/23 1500]  Encounter Vitals Group     BP (!) 158/68     Systolic BP Percentile      Diastolic BP Percentile      Pulse Rate 100     Resp 20     Temp 98.8 F (37.1 C)     Temp src      SpO2 98 %     Weight      Height      Head Circumference      Peak Flow      Pain Score      Pain Loc      Pain Education      Exclude from Growth Chart    No data found.  Updated Vital Signs BP (!) 158/68   Pulse 100   Temp 98.8 F (37.1 C)   Resp 20   SpO2 98%   Visual Acuity Right Eye Distance:   Left Eye Distance:   Bilateral Distance:    Right Eye Near:   Left Eye Near:    Bilateral Near:     Physical Exam Constitutional:      Appearance: Normal appearance.  Eyes:     Extraocular Movements: Extraocular movements intact.  Pulmonary:     Effort: Pulmonary effort is  normal.  Abdominal:     Tenderness: There is no abdominal tenderness. There is no right CVA tenderness, left CVA tenderness or guarding.  Neurological:     Mental Status: She is alert and oriented to person, place, and time. Mental status is at baseline.      UC Treatments / Results  Labs (all labs ordered are listed, but only abnormal results are displayed) Labs Reviewed  POCT URINALYSIS DIP (MANUAL ENTRY) - Abnormal; Notable for the following components:      Result Value   Color, UA light yellow (*)    Clarity, UA cloudy (*)    Blood, UA small (*)    Leukocytes, UA Large (3+) (*)    All other components within normal limits  URINE CULTURE    EKG   Radiology No results found.  Procedures Procedures (including critical care time)  Medications Ordered in UC Medications - No data to display  Initial Impression / Assessment and Plan / UC Course  I have reviewed the triage vital signs and the nursing notes.  Pertinent labs & imaging results that were available during my care of the patient were reviewed by me and considered in my medical decision making (see chart for details).  .  Urinalysis showing leukocytes, negative for nitrates, sent for culture, discussed findings with patient, prescribed Macrobid  as patient is symptomatic and recommended supportive care with follow-up with urgent care as needed Final Clinical Impressions(s) / UC Diagnoses   Final diagnoses:  Dysuria   Discharge Instructions      Your urinalysis does not show bacteria at this time, your urine will be sent to the lab to determine exactly which bacteria is present, if any changes need to be made to your medications you will be notified  Begin use of Macrobid  twice daily for 5 days  You may use over-the-counter Azo to help minimize your symptoms until antibiotic removes bacteria, this medication will turn your urine orange  Increase your fluid intake through use of water  As always practice  good hygiene, wiping front to back and avoidance of  scented vaginal products to prevent further irritation  If symptoms continue to persist after use of medication or recur please follow-up with urgent care or your primary doctor as needed   ED Prescriptions     Medication Sig Dispense Auth. Provider   nitrofurantoin , macrocrystal-monohydrate, (MACROBID ) 100 MG capsule Take 1 capsule (100 mg total) by mouth 2 (two) times daily. 10 capsule Zaeem Kandel R, NP      PDMP not reviewed this encounter.   Reena Canning, [place] 12/06/23 [phone]

## 2023-12-07 ENCOUNTER — Ambulatory Visit (HOSPITAL_COMMUNITY): Payer: Self-pay
